# Patient Record
Sex: Male | Born: 1950 | Race: Asian | Hispanic: No | Marital: Married | Smoking: Former smoker
Health system: Southern US, Community
[De-identification: ages and names within clinical notes are randomized; demographics above are authoritative.]

## PROBLEM LIST (undated history)

## (undated) DIAGNOSIS — E119 Type 2 diabetes mellitus without complications: Secondary | ICD-10-CM

## (undated) DIAGNOSIS — E785 Hyperlipidemia, unspecified: Secondary | ICD-10-CM

## (undated) HISTORY — PX: OTHER SURGICAL HISTORY: SHX169

## (undated) HISTORY — DX: Hyperlipidemia, unspecified: E78.5

## (undated) HISTORY — DX: Type 2 diabetes mellitus without complications: E11.9

---

## 2015-09-17 ENCOUNTER — Ambulatory Visit (INDEPENDENT_AMBULATORY_CARE_PROVIDER_SITE_OTHER): Payer: Self-pay

## 2015-09-17 ENCOUNTER — Ambulatory Visit (INDEPENDENT_AMBULATORY_CARE_PROVIDER_SITE_OTHER): Payer: Self-pay | Admitting: Family Medicine

## 2015-09-17 VITALS — BP 160/92 | HR 91 | Temp 98.5°F | Resp 16 | Ht 68.0 in | Wt 148.0 lb

## 2015-09-17 DIAGNOSIS — R030 Elevated blood-pressure reading, without diagnosis of hypertension: Secondary | ICD-10-CM

## 2015-09-17 DIAGNOSIS — M5412 Radiculopathy, cervical region: Secondary | ICD-10-CM

## 2015-09-17 DIAGNOSIS — E119 Type 2 diabetes mellitus without complications: Secondary | ICD-10-CM

## 2015-09-17 DIAGNOSIS — M542 Cervicalgia: Secondary | ICD-10-CM

## 2015-09-17 DIAGNOSIS — IMO0001 Reserved for inherently not codable concepts without codable children: Secondary | ICD-10-CM

## 2015-09-17 DIAGNOSIS — Z79899 Other long term (current) drug therapy: Secondary | ICD-10-CM

## 2015-09-17 DIAGNOSIS — R739 Hyperglycemia, unspecified: Secondary | ICD-10-CM

## 2015-09-17 LAB — BASIC METABOLIC PANEL
BUN: 15 mg/dL (ref 7–25)
CALCIUM: 9.7 mg/dL (ref 8.6–10.3)
CO2: 26 mmol/L (ref 20–31)
Chloride: 99 mmol/L (ref 98–110)
Creat: 0.73 mg/dL (ref 0.70–1.25)
Glucose, Bld: 200 mg/dL — ABNORMAL HIGH (ref 65–99)
POTASSIUM: 3.8 mmol/L (ref 3.5–5.3)
SODIUM: 136 mmol/L (ref 135–146)

## 2015-09-17 LAB — POCT GLYCOSYLATED HEMOGLOBIN (HGB A1C): Hemoglobin A1C: 10

## 2015-09-17 LAB — GLUCOSE, POCT (MANUAL RESULT ENTRY): POC GLUCOSE: 188 mg/dL — AB (ref 70–99)

## 2015-09-17 MED ORDER — METFORMIN HCL 1000 MG PO TABS: 1000.0000 mg | ORAL_TABLET | Freq: Two times a day (BID) | ORAL | Status: DC

## 2015-09-17 MED ORDER — CYCLOBENZAPRINE HCL 5 MG PO TABS: ORAL_TABLET | ORAL | Status: DC

## 2015-09-17 MED ORDER — LISINOPRIL 5 MG PO TABS: 5.0000 mg | ORAL_TABLET | Freq: Every day | ORAL | Status: DC

## 2015-09-17 MED ORDER — TRAMADOL HCL 50 MG PO TABS: 50.0000 mg | ORAL_TABLET | Freq: Three times a day (TID) | ORAL | Status: DC | PRN

## 2015-09-17 NOTE — Patient Instructions (Addendum)
Because you received an x-ray today, you will receive an invoice from [facility]. Please contact [facility] at [phone] with questions or concerns regarding your invoice. Our billing staff will not be able to assist you with those questions.  Because you received labwork today, you will receive an invoice from [employer]. Please contact [facility] at [phone] with questions or concerns regarding your invoice. Our billing staff will not be able to assist you with those questions.  You will be contacted with the lab results as soon as they are available. The fastest way to get your results is to activate your My Chart account. Instructions are located on the last page of this paperwork. If you have not heard from [place] regarding the results in 2 weeks, please contact this office.  Your blood testing today does indicate that you have diabetes. Start metformin 1 pill once per day, see other information on diabetes below, and recheck in the next few weeks to discuss this further. Initially he can start the metformin 1 pill once per day for the first week, then increase to twice per day.  Your blood pressure is also elevated today, and you may have hypertension. See information on this condition below. I will start her on a low-dose blood pressure medicine today, they can also help protect her kidneys with diabetes.Keep a record of your blood pressures outside of the office and bring them to the next office visit in 2-3 weeks.   For the pain in your arm, we are somewhat limited in medications because of your elevated blood pressure and diabetes. Can take muscle relaxant up to every 8 hours, or if needed for severe pain, I did prescribe another pain medicine for you. Be careful combining these 2 medicines as they both can cause sedation.. If this is not helping your symptoms in the next week to 10 days, return as we may need to try prednisone at that time, but  this would elevate your blood sugar further.  Cervical Radiculopathy Cervical radiculopathy happens when a nerve in the neck (cervical nerve) is pinched or bruised. This condition can develop because of an injury or as part of the normal aging process. Pressure on the cervical nerves can cause pain or numbness that runs from the neck all the way down into the arm and fingers. Usually, this condition gets better with rest. Treatment may be needed if the condition does not improve.  CAUSES This condition may be caused by:  Injury.  Slipped (herniated) disk.  Muscle tightness in the neck because of overuse.  Arthritis.  Breakdown or degeneration in the bones and joints of the spine (spondylosis) due to aging.  Bone spurs that may develop near the cervical nerves. SYMPTOMS Symptoms of this condition include:  Pain that runs from the neck to the arm and hand. The pain can be severe or irritating. It may be worse when the neck is moved.  Numbness or weakness in the affected arm and hand. DIAGNOSIS This condition may be diagnosed based on symptoms, medical history, and a physical exam. You may also have tests, including:  X-rays.  CT scan.  MRI.  Electromyogram (EMG).  Nerve conduction tests. TREATMENT In many cases, treatment is not needed for this condition. With rest, the condition usually gets better over time. If treatment is needed, options may include:  Wearing a soft neck collar for short periods of time.  Physical therapy to strengthen your neck muscles.  Medicines, such as NSAIDs,  oral corticosteroids, or spinal injections.  Surgery. This may be needed if other treatments do not help. Various types of surgery may be done depending on the cause of your problems. HOME CARE INSTRUCTIONS Managing Pain  Take over-the-counter and prescription medicines only as told by your health care provider.  If directed, apply ice to the affected area.  Put ice in a plastic  bag.  Place a towel between your skin and the bag.  Leave the ice on for 20 minutes, 2-3 times per day.  If ice does not help, you can try using heat. Take a warm shower or warm bath, or use a heat pack as told by your health care provider.  Try a gentle neck and shoulder massage to help relieve symptoms. Activity  Rest as needed. Follow instructions from your health care provider about any restrictions on activities.  Do stretching and strengthening exercises as told by your health care provider or physical therapist. General Instructions  If you were given a soft collar, wear it as told by your health care provider.  Use a flat pillow when you sleep.  Keep all follow-up visits as told by your health care provider. This is important. SEEK MEDICAL CARE IF:  Your condition does not improve with treatment. SEEK IMMEDIATE MEDICAL CARE IF:  Your pain gets much worse and cannot be controlled with medicines.  You have weakness or numbness in your hand, arm, face, or leg.  You have a high fever.  You have a stiff, rigid neck.  You lose control of your bowels or your bladder (have incontinence).  You have trouble with walking, balance, or speaking.   This information is not intended to replace advice given to you by your health care provider. Make sure you discuss any questions you have with your health care provider.   Document Released: 03/29/2001 Document Revised: 03/25/2015 Document Reviewed: 08/28/2014 Elsevier Interactive Patient Education 2016 [employer].   Diabetes and Standards of Medical Care Diabetes is complicated. You may find that your diabetes team includes a dietitian, nurse, diabetes educator, eye doctor, and more. To help everyone know what is going on and to help you get the care you deserve, the following schedule of care was developed to help keep you on track. Below are the tests, exams, vaccines, medicines, education, and plans you will need. HbA1c  test This test shows how well you have controlled your glucose over the past 2-3 months. It is used to see if your diabetes management plan needs to be adjusted.   It is performed at least 2 times a year if you are meeting treatment goals.  It is performed 4 times a year if therapy has changed or if you are not meeting treatment goals. Blood pressure test  This test is performed at every routine medical visit. The goal is less than 140/90 mm Hg for most people, but 130/80 mm Hg in some cases. Ask your health care provider about your goal. Dental exam  Follow up with the dentist regularly. Eye exam  If you are diagnosed with type 1 diabetes as a child, get an exam upon reaching the age of 51 years or older and having had diabetes for 3-5 years. Yearly eye exams are recommended after that initial eye exam.  If you are diagnosed with type 1 diabetes as an adult, get an exam within 5 years of diagnosis and then yearly.  If you are diagnosed with type 2 diabetes, get an exam as soon as  possible after the diagnosis and then yearly. Foot care exam  Visual foot exams are performed at every routine medical visit. The exams check for cuts, injuries, or other problems with the feet.  You should have a complete foot exam performed every year. This exam includes an inspection of the structure and skin of your feet, a check of the pulses in your feet, and a check of the sensation in your feet.  Type 1 diabetes: The first exam is performed 5 years after diagnosis.  Type 2 diabetes: The first exam is performed at the time of diagnosis.  Check your feet nightly for cuts, injuries, or other problems with your feet. Tell your health care provider if anything is not healing. Kidney function test (urine microalbumin)  This test is performed once a year.  Type 1 diabetes: The first test is performed 5 years after diagnosis.  Type 2 diabetes: The first test is performed at the time of diagnosis.  A  serum creatinine and estimated glomerular filtration rate (eGFR) test is done once a year to assess the level of chronic kidney disease (CKD), if present. Lipid profile (cholesterol, HDL, LDL, triglycerides)  Performed every 5 years for most people.  The goal for LDL is less than 100 mg/dL. If you are at high risk, the goal is less than 70 mg/dL.  The goal for HDL is 40 mg/dL-50 mg/dL for men and 50 mg/dL-60 mg/dL for women. An HDL cholesterol of 60 mg/dL or higher gives some protection against heart disease.  The goal for triglycerides is less than 150 mg/dL. Immunizations  The flu (influenza) vaccine is recommended yearly for every person 72 months of age or older who has diabetes.  The pneumonia (pneumococcal) vaccine is recommended for every person 68 years of age or older who has diabetes. Adults 67 years of age or older may receive the pneumonia vaccine as a series of two separate shots.  The hepatitis B vaccine is recommended for adults shortly after they have been diagnosed with diabetes.  The Tdap (tetanus, diphtheria, and pertussis) vaccine should be given:  According to normal childhood vaccination schedules, for children.  Every 10 years, for adults who have diabetes. Diabetes self-management education  Education is recommended at diagnosis and ongoing as needed. Treatment plan  Your treatment plan is reviewed at every medical visit.   This information is not intended to replace advice given to you by your health care provider. Make sure you discuss any questions you have with your health care provider.   Document Released: 05/01/2009 Document Revised: 07/25/2014 Document Reviewed: 12/04/2012 Elsevier Interactive Patient Education 2016 [employer].    Hypertension Hypertension, commonly called high blood pressure, is when the force of blood pumping through your arteries is too strong. Your arteries are the blood vessels that carry blood from your heart throughout  your body. A blood pressure reading consists of a higher number over a lower number, such as 110/72. The higher number (systolic) is the pressure inside your arteries when your heart pumps. The lower number (diastolic) is the pressure inside your arteries when your heart relaxes. Ideally you want your blood pressure below 120/80. Hypertension forces your heart to work harder to pump blood. Your arteries may become narrow or stiff. Having untreated or uncontrolled hypertension can cause heart attack, stroke, kidney disease, and other problems. RISK FACTORS Some risk factors for high blood pressure are controllable. Others are not.  Risk factors you cannot control include:   Race. You may be  at higher risk if you are African American.  Age. Risk increases with age.  Gender. Men are at higher risk than women before age 61 years. After age 15, women are at higher risk than men. Risk factors you can control include:  Not getting enough exercise or physical activity.  Being overweight.  Getting too much fat, sugar, calories, or salt in your diet.  Drinking too much alcohol. SIGNS AND SYMPTOMS Hypertension does not usually cause signs or symptoms. Extremely high blood pressure (hypertensive crisis) may cause headache, anxiety, shortness of breath, and nosebleed. DIAGNOSIS To check if you have hypertension, your health care provider will measure your blood pressure while you are seated, with your arm held at the level of your heart. It should be measured at least twice using the same arm. Certain conditions can cause a difference in blood pressure between your right and left arms. A blood pressure reading that is higher than normal on one occasion does not mean that you need treatment. If it is not clear whether you have high blood pressure, you may be asked to return on a different day to have your blood pressure checked again. Or, you may be asked to monitor your blood pressure at home for 1 or more  weeks. TREATMENT Treating high blood pressure includes making lifestyle changes and possibly taking medicine. Living a healthy lifestyle can help lower high blood pressure. You may need to change some of your habits. Lifestyle changes may include:  Following the DASH diet. This diet is high in fruits, vegetables, and whole grains. It is low in salt, red meat, and added sugars.  Keep your sodium intake below 2,300 mg per day.  Getting at least 30-45 minutes of aerobic exercise at least 4 times per week.  Losing weight if necessary.  Not smoking.  Limiting alcoholic beverages.  Learning ways to reduce stress. Your health care provider may prescribe medicine if lifestyle changes are not enough to get your blood pressure under control, and if one of the following is true:  You are 26-74 years of age and your systolic blood pressure is above 140.  You are 68 years of age or older, and your systolic blood pressure is above 150.  Your diastolic blood pressure is above 90.  You have diabetes, and your systolic blood pressure is over 355 or your diastolic blood pressure is over 90.  You have kidney disease and your blood pressure is above 140/90.  You have heart disease and your blood pressure is above 140/90. Your personal target blood pressure may vary depending on your medical conditions, your age, and other factors. HOME CARE INSTRUCTIONS  Have your blood pressure rechecked as directed by your health care provider.   Take medicines only as directed by your health care provider. Follow the directions carefully. Blood pressure medicines must be taken as prescribed. The medicine does not work as well when you skip doses. Skipping doses also puts you at risk for problems.  Do not smoke.   Monitor your blood pressure at home as directed by your health care provider. SEEK MEDICAL CARE IF:   You think you are having a reaction to medicines taken.  You have recurrent headaches or  feel dizzy.  You have swelling in your ankles.  You have trouble with your vision. SEEK IMMEDIATE MEDICAL CARE IF:  You develop a severe headache or confusion.  You have unusual weakness, numbness, or feel faint.  You have severe chest or abdominal pain.  You vomit repeatedly.  You have trouble breathing. MAKE SURE YOU:   Understand these instructions.  Will watch your condition.  Will get help right away if you are not doing well or get worse.   This information is not intended to replace advice given to you by your health care provider. Make sure you discuss any questions you have with your health care provider.   Document Released: 07/04/2005 Document Revised: 11/18/2014 Document Reviewed: 04/26/2013 Elsevier Interactive Patient Education [employer].

## 2015-09-17 NOTE — Progress Notes (Addendum)
Subjective:    Patient ID: Richard Reeves, male    DOB: Nov 28, 1950, 65 y.o.   MRN: [id number] By signing my name below, I, Zola Button, attest that this documentation has been prepared under the direction and in the presence of Merri Ray, MD.  Electronically Signed: Zola Button, Medical Scribe. 09/17/2015. 9:48 AM.  HPI HPI Comments: Richard Reeves is a 65 y.o. male who presents to the [facility] complaining of left shoulder pain radiating into the left arm that started about 1.5 months ago, but worsened a few days ago. He has tried Advil but without relief. He has some discomfort when moving his neck. Patient denies weakness, chest pain and SOB. He also denies known injury and history of DM or HTN. He is right hand dominant. Patient works at [employer] and mopped the floor quickly a few days ago at work; he believes this may have caused the pain to worsen.  No PCP per patient. He has been told he has high blood sugar in the past, but no regular ongoing care.  Patient speaks [place]. His friend is here to translate.  At end of visit - notes he has had diabetes before and has been treated with metformin previously, but previous doctor no longer in practice.  No known history of hypertension. Home readings 130/85-90's.    There are no active problems to display for this patient.  History reviewed. No pertinent past medical history. History reviewed. No pertinent past surgical history. No Known Allergies Prior to Admission medications   Not on File   Social History   Social History  . Marital Status: Married    Spouse Name: N/A  . Number of Children: N/A  . Years of Education: N/A   Occupational History  . Not on file.   Social History Main Topics  . Smoking status: Never Smoker   . Smokeless tobacco: Not on file  . Alcohol Use: Not on file  . Drug Use: Not on file  . Sexual Activity: Not on file   Other Topics Concern  . Not on file   Social History  Narrative  . No narrative on file     Review of Systems  Respiratory: Negative for shortness of breath.   Cardiovascular: Negative for chest pain.  Musculoskeletal: Positive for arthralgias.  Neurological: Negative for weakness.       Objective:   Physical Exam  Constitutional: He is oriented to person, place, and time. He appears well-developed and well-nourished. No distress.  HENT:  Head: Normocephalic and atraumatic.  Mouth/Throat: Oropharynx is clear and moist. No oropharyngeal exudate.  Eyes: Pupils are equal, round, and reactive to light.  Neck: Neck supple.  Reproduction of pain with neck flexion. Slightly decreased extension. Normal right rotation. Pain with left rotation, slightly decreased ROM. Decreased left lateroflexion with reproduction of pain. Normal right lateroflexion.  Cardiovascular: Normal rate, regular rhythm and normal heart sounds.  Exam reveals no gallop and no friction rub.   No murmur heard. Pulmonary/Chest: Effort normal and breath sounds normal. No respiratory distress. He has no wheezes.  Clear to auscultation bilaterally.   Musculoskeletal: He exhibits tenderness. He exhibits no edema.  Slight tenderness along left rhomboid. Left shoulder: full ROM, no [place]/AC or clavicle tenderness.  Neurological: He is alert and oriented to person, place, and time. No cranial nerve deficit.  Equal grip strength. Equal strength in upper extremities. Full rotator cuff strength.  Skin: Skin is warm and dry. No rash  noted.  Psychiatric: He has a normal mood and affect. His behavior is normal.  Nursing note and vitals reviewed.   Filed Vitals:   09/17/15 0912  BP: 160/92  Pulse: 91  Temp: 98.5 F (36.9 C)  Resp: 16  Height: 5\' 8"  (1.727 m)  Weight: 148 lb (67.132 kg)  SpO2: 98%    Results for orders placed or performed in visit on 09/17/15  POCT glucose (manual entry)  Result Value Ref Range   POC Glucose 188 (A) 70 - 99 mg/dl  POCT glycosylated hemoglobin  (Hb A1C)  Result Value Ref Range   Hemoglobin A1C 10.0     Dg Cervical Spine 2 Or 3 Views  09/17/2015  CLINICAL DATA:  Neck pain. EXAM: CERVICAL SPINE - 2-3 VIEW COMPARISON:  No prior. FINDINGS: Diffuse degenerative change cervical spine. Prominent disc space loss noted C6-C7 with prominent endplate osteophytes. Loss of normal cervical lordosis. No acute bony abnormality . Mild biapical pleural thickening noted consistent with scarring. IMPRESSION: Loss of normal cervical lordosis. No acute abnormality. Prominent C6-C7 disc degeneration and endplate osteophyte formation. Electronically Signed   By: Marcello Moores  Register   On: 09/17/2015 10:23       Assessment & Plan:   Richard Reeves is a 65 y.o. male Neck pain on left side - Plan: DG Cervical Spine 2 or 3 views Left cervical radiculopathy - Plan: DG Cervical Spine 2 or 3 views, cyclobenzaprine (FLEXERIL) 5 MG tablet, traMADol (ULTRAM) 50 MG tablet High risk medication use - Plan: POCT glucose (manual entry), Basic metabolic panel  - L cervical radiculopathy.  No weakness, reflexes normal  -due to uncontreated diabetes and hypertension - will hold on prednisone or nsaids for now.   -flexeril tid prn, ultram if needed - SED and cautioned on combo.   -if not improved in next 7-10 days, rtc for possible prednisone.    Elevated blood pressure - Plan: Basic metabolic panel, lisinopril (PRINIVIL,ZESTRIL) 5 MG tablet  - suspected hypertension -untreated. Start lisinopril 5mg  qd and recheck in next 1 month  Hyperglycemia - Plan: POCT glycosylated hemoglobin (Hb A1C) Type 2 diabetes mellitus without complication, without long-term current use of insulin ([place]) - Plan: metFORMIN (GLUCOPHAGE) 1000 MG tablet, lisinopril (PRINIVIL,ZESTRIL) 5 MG tablet  - initially did not disclose he had been on medication, but had been taking metformin recently  - metfomin 500mg  BID - start QD and recheck in 1 month.    rtc precautions.   Language barrier - translator present,  understanding expressed.  Meds ordered this encounter  Medications  . metFORMIN (GLUCOPHAGE) 1000 MG tablet    Sig: Take 1 tablet (1,000 mg total) by mouth 2 (two) times daily with a meal. Start once per day for first week.    Dispense:  60 tablet    Refill:  1  . lisinopril (PRINIVIL,ZESTRIL) 5 MG tablet    Sig: Take 1 tablet (5 mg total) by mouth daily.    Dispense:  30 tablet    Refill:  1  . cyclobenzaprine (FLEXERIL) 5 MG tablet    Sig: 1 pill by mouth up to every 8 hours as needed. Start with one pill by mouth each bedtime as needed due to sedation    Dispense:  20 tablet    Refill:  0  . traMADol (ULTRAM) 50 MG tablet    Sig: Take 1 tablet (50 mg total) by mouth every 8 (eight) hours as needed.    Dispense:  30 tablet  Refill:  0   Patient Instructions  Because you received an x-ray today, you will receive an invoice from [facility]. Please contact [facility] at [phone] with questions or concerns regarding your invoice. Our billing staff will not be able to assist you with those questions.  Because you received labwork today, you will receive an invoice from [employer]. Please contact [facility] at [phone] with questions or concerns regarding your invoice. Our billing staff will not be able to assist you with those questions.  You will be contacted with the lab results as soon as they are available. The fastest way to get your results is to activate your My Chart account. Instructions are located on the last page of this paperwork. If you have not heard from [place] regarding the results in 2 weeks, please contact this office.  Your blood testing today does indicate that you have diabetes. Start metformin 1 pill once per day, see other information on diabetes below, and recheck in the next few weeks to discuss this further. Initially he can start the metformin 1 pill once per day for the first week, then increase to twice per  day.  Your blood pressure is also elevated today, and you may have hypertension. See information on this condition below. I will start her on a low-dose blood pressure medicine today, they can also help protect her kidneys with diabetes.Keep a record of your blood pressures outside of the office and bring them to the next office visit in 2-3 weeks.   For the pain in your arm, we are somewhat limited in medications because of your elevated blood pressure and diabetes. Can take muscle relaxant up to every 8 hours, or if needed for severe pain, I did prescribe another pain medicine for you. Be careful combining these 2 medicines as they both can cause sedation.. If this is not helping your symptoms in the next week to 10 days, return as we may need to try prednisone at that time, but this would elevate your blood sugar further.        I personally performed the services described in this documentation, which was scribed in my presence. The recorded information has been reviewed and considered, and addended by me as needed.

## 2015-09-23 ENCOUNTER — Ambulatory Visit (INDEPENDENT_AMBULATORY_CARE_PROVIDER_SITE_OTHER): Payer: Self-pay | Admitting: Urgent Care

## 2015-09-23 VITALS — BP 112/72 | HR 69 | Temp 97.6°F | Resp 16 | Ht 68.5 in | Wt 151.8 lb

## 2015-09-23 DIAGNOSIS — M542 Cervicalgia: Secondary | ICD-10-CM

## 2015-09-23 DIAGNOSIS — M5030 Other cervical disc degeneration, unspecified cervical region: Secondary | ICD-10-CM

## 2015-09-23 DIAGNOSIS — M5412 Radiculopathy, cervical region: Secondary | ICD-10-CM

## 2015-09-23 DIAGNOSIS — Z789 Other specified health status: Secondary | ICD-10-CM

## 2015-09-23 DIAGNOSIS — E119 Type 2 diabetes mellitus without complications: Secondary | ICD-10-CM

## 2015-09-23 MED ORDER — TRAMADOL HCL 50 MG PO TABS: 50.0000 mg | ORAL_TABLET | Freq: Three times a day (TID) | ORAL | Status: DC | PRN

## 2015-09-23 MED ORDER — CYCLOBENZAPRINE HCL 5 MG PO TABS: ORAL_TABLET | ORAL | Status: DC

## 2015-09-23 MED ORDER — MELOXICAM 7.5 MG PO TABS: 7.5000 mg | ORAL_TABLET | Freq: Every day | ORAL | Status: DC

## 2015-09-23 NOTE — Progress Notes (Signed)
    MRN: [id number] DOB: July 10, 1951  Subjective:   Richard Reeves is a 65 y.o. male presenting for follow up on neck pain. He was initially seen by Dr. Carlota Raspberry on 09/17/2015. X-ray showed prominent DDD with endplate osteophyte formation at C6-C7. He was subsequently prescribed Flexeril, Tramadol and advised to rtc in 7 days if no improvement. Today, he reports some improvement in his neck pain with the use of Flexeril and Tramadol. However, he still feels his neck pain radiate into his left arm up to the forearm. Denies trauma, fever, numbness or tingling, weakness, rashes.  Gralyn has a current medication list which includes the following prescription(s): cyclobenzaprine, lisinopril, metformin, and tramadol. Also has No Known Allergies.  Ad  has no past medical history on file. Also  has no past surgical history on file.  Objective:   Vitals: BP 112/72 mmHg  Pulse 69  Temp(Src) 97.6 F (36.4 C) (Oral)  Resp 16  Ht 5' 8.5" (1.74 m)  Wt 151 lb 12.8 oz (68.856 kg)  BMI 22.74 kg/m2  SpO2 98%  Physical Exam  Constitutional: He is oriented to person, place, and time. He appears well-developed and well-nourished.  HENT:  Mouth/Throat: Oropharynx is clear and moist.  Eyes: Pupils are equal, round, and reactive to light. No scleral icterus.  Cardiovascular: Normal rate, regular rhythm and intact distal pulses.  Exam reveals no gallop and no friction rub.   No murmur heard. Pulmonary/Chest: No respiratory distress. He has no wheezes. He has no rales.  Musculoskeletal:       Cervical back: He exhibits decreased range of motion (throughout), tenderness (Positive Spurling maneuver up to left bicep) and spasm (left trapezius). He exhibits no bony tenderness, no swelling, no edema, no deformity and no laceration.  Strength 5/5.  Neurological: He is alert and oriented to person, place, and time.  Skin: Skin is warm and dry.    Cervical Spine x-ray from 09/17/2015 IMPRESSION: Loss of normal cervical  lordosis. No acute abnormality. Prominent C6-C7 disc degeneration and endplate osteophyte formation.  Assessment and Plan :   1. Language barrier - Patient's son Katherine Roan is helping [place] translate today. He provided his phone number as well: [phone]  2. Degenerative disc disease, cervical 3. Left cervical radiculopathy 4. Neck pain - Counseled on diagnosis of DDD, offered meloxicam, refilled Tramadol, Flexeril. Referral to ortho pending per patient's request.  5. Type 2 diabetes mellitus without complication, without long-term current use of insulin ([place]) - Uncontrolled diabetes. Cr is well within normal limits. I am unwilling to do use prednisone due to his high A1c. Will use meloxicam instead. Patient is to continue efforts at diabetes control. Consider prednisone in the future if appropriate.  Jaynee Eagles, PA-C [facility] [phone] 09/23/2015 10:22 AM

## 2015-09-23 NOTE — Patient Instructions (Signed)
Degenerative Disk Disease  Degenerative disk disease is a condition caused by the changes that occur in spinal disks as you grow older. Spinal disks are soft and compressible disks located between the bones of your spine (vertebrae). These disks act like shock absorbers. Degenerative disk disease can affect the whole spine. However, the neck and lower back are most commonly affected. Many changes can occur in the spinal disks with aging, such as:  · The spinal disks may dry and shrink.  · Small tears may occur in the tough, outer covering of the disk (annulus).  · The disk space may become smaller due to loss of water.  · Abnormal growths in the bone (spurs) may occur. This can put pressure on the nerve roots exiting the spinal canal, causing pain.  · The spinal canal may become narrowed.  RISK FACTORS   · Being overweight.  · Having a family history of degenerative disk disease.  · Smoking.  · There is increased risk if you are doing heavy lifting or have a sudden injury.  SIGNS AND SYMPTOMS   Symptoms vary from person to person and may include:  · Pain that varies in intensity. Some people have no pain, while others have severe pain. The location of the pain depends on the part of your backbone that is affected.  ¨ You will have neck or arm pain if a disk in the neck area is affected.  ¨ You will have pain in your back, buttocks, or legs if a disk in the lower back is affected.  · Pain that becomes worse while bending, reaching up, or with twisting movements.  · Pain that may start gradually and then get worse as time passes. It may also start after a major or minor injury.  · Numbness or tingling in the arms or legs.  DIAGNOSIS   Your health care provider will ask you about your symptoms and about activities or habits that may cause the pain. He or she may also ask about any injuries, diseases, or treatments you have had. Your health care provider will examine you to check for the range of movement that is  possible in the affected area, to check for strength in your extremities, and to check for sensation in the areas of the arms and legs supplied by different nerve roots. You may also have:   · An X-ray of the spine.  · Other imaging tests, such as MRI.  TREATMENT   Your health care provider will advise you on the best plan for treatment. Treatment may include:  · Medicines.  · Rehabilitation exercises.  HOME CARE INSTRUCTIONS   · Follow proper lifting and walking techniques as advised by your health care provider.  · Maintain good posture.  · Exercise regularly as advised by your health care provider.  · Perform relaxation exercises.  · Change your sitting, standing, and sleeping habits as advised by your health care provider.  · Change positions frequently.  · Lose weight or maintain a healthy weight as advised by your health care provider.  · Do not use any tobacco products, including cigarettes, chewing tobacco, or electronic cigarettes. If you need help quitting, ask your health care provider.  · Wear supportive footwear.  · Take medicines only as directed by your health care provider.  SEEK MEDICAL CARE IF:   · Your pain does not go away within 1-4 weeks.  · You have significant appetite or weight loss.  SEEK IMMEDIATE MEDICAL CARE IF:   ·   Your pain is severe.  · You notice weakness in your arms, hands, or legs.  · You begin to lose control of your bladder or bowel movements.  · You have fevers or night sweats.  MAKE SURE YOU:   · Understand these instructions.  · Will watch your condition.  · Will get help right away if you are not doing well or get worse.     This information is not intended to replace advice given to you by your health care provider. Make sure you discuss any questions you have with your health care provider.     Document Released: 05/01/2007 Document Revised: 07/25/2014 Document Reviewed: 11/05/2013  Elsevier Interactive Patient Education ©2016 [employer]

## 2016-01-01 ENCOUNTER — Emergency Department (HOSPITAL_COMMUNITY)
Admission: EM | Admit: 2016-01-01 | Discharge: 2016-01-01 | Disposition: A | Discharge status: Home / Self Care | Payer: Self-pay | Attending: Emergency Medicine | Admitting: Emergency Medicine

## 2016-01-01 ENCOUNTER — Ambulatory Visit (INDEPENDENT_AMBULATORY_CARE_PROVIDER_SITE_OTHER): Payer: Self-pay | Admitting: Family Medicine

## 2016-01-01 ENCOUNTER — Encounter (HOSPITAL_COMMUNITY): Payer: Self-pay

## 2016-01-01 VITALS — BP 142/76 | HR 69 | Temp 98.0°F | Resp 14 | Ht 68.0 in | Wt 152.0 lb

## 2016-01-01 DIAGNOSIS — Y929 Unspecified place or not applicable: Secondary | ICD-10-CM | POA: Insufficient documentation

## 2016-01-01 DIAGNOSIS — Y939 Activity, unspecified: Secondary | ICD-10-CM | POA: Insufficient documentation

## 2016-01-01 DIAGNOSIS — Z23 Encounter for immunization: Secondary | ICD-10-CM

## 2016-01-01 DIAGNOSIS — W2203XA Walked into furniture, initial encounter: Secondary | ICD-10-CM | POA: Insufficient documentation

## 2016-01-01 DIAGNOSIS — S0180XA Unspecified open wound of other part of head, initial encounter: Secondary | ICD-10-CM

## 2016-01-01 DIAGNOSIS — S0181XA Laceration without foreign body of other part of head, initial encounter: Secondary | ICD-10-CM | POA: Insufficient documentation

## 2016-01-01 DIAGNOSIS — Y999 Unspecified external cause status: Secondary | ICD-10-CM | POA: Insufficient documentation

## 2016-01-01 DIAGNOSIS — Z5321 Procedure and treatment not carried out due to patient leaving prior to being seen by health care provider: Secondary | ICD-10-CM | POA: Insufficient documentation

## 2016-01-01 NOTE — ED Notes (Signed)
Patient present with 1 in laceration to the side of the left eye bleeding controlled.  Patient states that he rolled over into the bed this morning and hit his head on the corner of the bedside table.  Patient rates pain 5/10

## 2016-01-01 NOTE — Progress Notes (Signed)
By signing my name below I, Tereasa Coop, attest that this documentation has been prepared under the direction and in the presence of Wendie Agreste, MD. Electonically Signed. Tereasa Coop, Scribe 01/01/2016 at 9:33 AM  Subjective:    Patient ID: Richard Reeves, male    DOB: 1951-01-03, 65 y.o.   MRN: [id number]  Chief Complaint  Patient presents with  . Head Injury    Rolled over in sleep and hit left side of head    HPI Terrial Mizer is a 65 y.o. male who presents to the [facility] complaining of pain in left temporal region that started this morning, 4 hrs ago, when pt rolled out of bed while sleeping and hit his head against his bed side table. Pt denies any change in vision, LOC, HA, neck pain, dizziness, lightheadedness, or N/V/D. Pt c/o a laceration on his left temporal region and states he put peroxide over wound this morning.   Pt states his tetanus shot was more than 5 years ago.     There are no active problems to display for this patient.  No past medical history on file. No past surgical history on file. No Known Allergies Prior to Admission medications   Medication Sig Start Date End Date Taking? Authorizing Provider  cyclobenzaprine (FLEXERIL) 5 MG tablet Take 1 pill every 8 hours as needed. If you experience drowsiness, then take at bedtime only. Patient not taking: Reported on 01/01/2016 09/23/15   Jaynee Eagles, PA-C  lisinopril (PRINIVIL,ZESTRIL) 5 MG tablet Take 1 tablet (5 mg total) by mouth daily. Patient not taking: Reported on 01/01/2016 09/17/15   Wendie Agreste, MD  meloxicam (MOBIC) 7.5 MG tablet Take 1-2 tablets (7.5-15 mg total) by mouth daily. Patient not taking: Reported on 01/01/2016 09/23/15   Jaynee Eagles, PA-C  metFORMIN (GLUCOPHAGE) 1000 MG tablet Take 1 tablet (1,000 mg total) by mouth 2 (two) times daily with a meal. Start once per day for first week. Patient not taking: Reported on 01/01/2016 09/17/15   Wendie Agreste, MD  traMADol (ULTRAM) 50  MG tablet Take 1 tablet (50 mg total) by mouth every 8 (eight) hours as needed. Patient not taking: Reported on 01/01/2016 09/23/15   Jaynee Eagles, PA-C   Social History   Social History  . Marital Status: Married    Spouse Name: N/A  . Number of Children: N/A  . Years of Education: N/A   Occupational History  . Not on file.   Social History Main Topics  . Smoking status: Never Smoker   . Smokeless tobacco: Not on file  . Alcohol Use: Not on file  . Drug Use: Not on file  . Sexual Activity: Not on file   Other Topics Concern  . Not on file   Social History Narrative      Review of Systems  Eyes: Negative for visual disturbance.  Gastrointestinal: Negative for nausea, vomiting and diarrhea.  Musculoskeletal: Negative for neck pain.  Skin: Positive for wound (left temporal laceration).  Neurological: Negative for dizziness, light-headedness and headaches.       Objective:   Physical Exam  Constitutional: He is oriented to person, place, and time. He appears well-developed and well-nourished. No distress.  HENT:  Head: Normocephalic and atraumatic.  Eyes: Conjunctivae and EOM are normal. Pupils are equal, round, and reactive to light. Right eye exhibits no nystagmus. Left eye exhibits no nystagmus.  Pain free ROM of eyes and orbit is non-tender.   Neck:  Neck supple.  Cardiovascular: Normal rate.   Pulmonary/Chest: Effort normal.  Musculoskeletal: Normal range of motion.  Neurological: He is alert and oriented to person, place, and time. He has normal strength. No cranial nerve deficit or sensory deficit. He displays a negative Romberg sign. Gait normal.  No pronator drift. Non-focal neuro exam.   Skin: Skin is warm and dry. Laceration noted.  Pt has a Curved/linear 2.5cm laceration above and lateral to left orbit. Wound is widened centrally.   Psychiatric: He has a normal mood and affect. His behavior is normal.  Nursing note and vitals reviewed.    Filed Vitals:    01/01/16 0851  BP: 142/76  Pulse: 69  Temp: 98 F (36.7 C)  TempSrc: Oral  Resp: 14  Height: 5\' 8"  (1.727 m)  Weight: 152 lb (68.947 kg)  SpO2: 97%         Assessment & Plan:    Deyon Wesson is a 65 y.o. male Wound, open, face, initial encounter - Plan: Tdap vaccine greater than or equal to 7yo IM Wound of left face as above. No other signs/symptoms of head injury at this time.  Wound repaired as an procedure note, Tdap updated, RTC precautions discussed, head injury precautions discussed.    No orders of the defined types were placed in this encounter.   Patient Instructions       IF you received an x-ray today, you will receive an invoice from [facility]. Please contact [facility] at [phone] with questions or concerns regarding your invoice.   IF you received labwork today, you will receive an invoice from [employer]. Please contact [facility] at [phone] with questions or concerns regarding your invoice.   Our billing staff will not be able to assist you with questions regarding bills from these companies.  You will be contacted with the lab results as soon as they are available. The fastest way to get your results is to activate your My Chart account. Instructions are located on the last page of this paperwork. If you have not heard from [place] regarding the results in 2 weeks, please contact this office.    WOUND CARE Please return in 5-7 days to have your stitches/staples removed or sooner if you have concerns. Marland Kitchen Keep area clean and dry for 24 hours. Do not remove bandage, if applied. . After 24 hours, remove bandage and wash wound gently with mild soap and warm water. Reapply a new bandage after cleaning wound, if directed. . Continue daily cleansing with soap and water until stitches/staples are removed. . Do not apply any ointments or creams to the wound while stitches/staples are in place, as this may  cause delayed healing. . Notify the office if you experience any of the following signs of infection: Swelling, redness, pus drainage, streaking, fever >101.0 F . Notify the office if you experience excessive bleeding that does not stop after 15-20 minutes of constant, firm Pressure.  HEAD INJURY If any of the following occur notify your physician or go to the Hospital Emergency Department: . Increased drowsiness, stupor or loss of consciousness . Restlessness or convulsions (fits) . Paralysis in arms or legs . Temperature above 100 F . Vomiting . Severe headache . Blood or clear fluid dripping from the nose or ears . Stiffness of the neck . Dizziness or blurred vision . Pulsating pain in the eye . Unequal pupils of eye . Personality changes . Any other unusual symptoms PRECAUTIONS . Keep head elevated at  all times for the first 24 hours (Elevate mattress if pillow is ineffective) . Do not take tranquilizers, sedatives, narcotics or alcohol . Avoid aspirin. Use only acetaminophen (e.g. Tylenol) or ibuprofen (e.g. Advil) for relief of pain. Follow directions on the bottle for dosage. . Use ice packs for comfort  MEDICATIONS Use medications only as directed by your physician        I personally performed the services described in this documentation, which was scribed in my presence. The recorded information has been reviewed and considered, and addended by me as needed.   Signed,   Merri Ray, MD [facility].  01/01/2016 9:35 AM

## 2016-01-01 NOTE — Patient Instructions (Addendum)
     IF you received an x-ray today, you will receive an invoice from [facility]. Please contact [facility] at [phone] with questions or concerns regarding your invoice.   IF you received labwork today, you will receive an invoice from [employer]. Please contact [facility] at [phone] with questions or concerns regarding your invoice.   Our billing staff will not be able to assist you with questions regarding bills from these companies.  You will be contacted with the lab results as soon as they are available. The fastest way to get your results is to activate your My Chart account. Instructions are located on the last page of this paperwork. If you have not heard from [place] regarding the results in 2 weeks, please contact this office.    WOUND CARE Please return in 5-7 days to have your stitches/staples removed or sooner if you have concerns. Marland Kitchen Keep area clean and dry for 24 hours. Do not remove bandage, if applied. . After 24 hours, remove bandage and wash wound gently with mild soap and warm water. Reapply a new bandage after cleaning wound, if directed. . Continue daily cleansing with soap and water until stitches/staples are removed. . Do not apply any ointments or creams to the wound while stitches/staples are in place, as this may cause delayed healing. . Notify the office if you experience any of the following signs of infection: Swelling, redness, pus drainage, streaking, fever >101.0 F . Notify the office if you experience excessive bleeding that does not stop after 15-20 minutes of constant, firm Pressure.  HEAD INJURY If any of the following occur notify your physician or go to the Hospital Emergency Department: . Increased drowsiness, stupor or loss of consciousness . Restlessness or convulsions (fits) . Paralysis in arms or legs . Temperature above 100 F . Vomiting . Severe headache . Blood or clear fluid  dripping from the nose or ears . Stiffness of the neck . Dizziness or blurred vision . Pulsating pain in the eye . Unequal pupils of eye . Personality changes . Any other unusual symptoms PRECAUTIONS . Keep head elevated at all times for the first 24 hours (Elevate mattress if pillow is ineffective) . Do not take tranquilizers, sedatives, narcotics or alcohol . Avoid aspirin. Use only acetaminophen (e.g. Tylenol) or ibuprofen (e.g. Advil) for relief of pain. Follow directions on the bottle for dosage. . Use ice packs for comfort  MEDICATIONS Use medications only as directed by your physician

## 2016-01-01 NOTE — Progress Notes (Signed)
Risk and benefits discussed and verbal consent obtained. Anesthetic allergies reviewed. Patient anesthetized using 1:1 mix of 2% lidocaine with epi and Marcaine. The wound was cleansed thoroughly with soap and water. Sterile prep and drape. Wound closed with 7 SI throws using 6-0 Ethilon suture material. Hemostasis achieved. Mupirocin applied to the wound and bandage placed. The patient tolerated well. Wound instructions were provided and the patient is to return in 7 days for suture removal. Philis Fendt, MS, PA-C 10:10 AM, 01/01/2016

## 2017-06-21 LAB — BASIC METABOLIC PANEL
BUN: 15 (ref 4–21)
Creatinine: 0.9 (ref 0.6–1.3)
GLUCOSE: 125
POTASSIUM: 3.9 (ref 3.4–5.3)
SODIUM: 137 (ref 137–147)

## 2017-06-21 LAB — CBC AND DIFFERENTIAL
HEMATOCRIT: 41 (ref 41–53)
HEMOGLOBIN: 13.9 (ref 13.5–17.5)
Platelets: 213 (ref 150–399)
WBC: 5.8

## 2017-06-21 LAB — LIPID PANEL
CHOLESTEROL: 221 — AB (ref 0–200)
HDL: 53 (ref 35–70)
LDL CALC: 141
Triglycerides: 144 (ref 40–160)

## 2017-06-21 LAB — HEMOGLOBIN A1C: Hemoglobin A1C: 9

## 2017-06-21 LAB — MICROALBUMIN, URINE: MICROALB UR: 1.4

## 2017-06-21 LAB — HEPATIC FUNCTION PANEL
ALT: 23 (ref 10–40)
AST: 24 (ref 14–40)
Alkaline Phosphatase: 82 (ref 25–125)
Bilirubin, Total: 3

## 2017-06-21 LAB — PSA: PSA: 0.6

## 2017-06-21 LAB — TSH: TSH: 3.07 (ref 0.41–5.90)

## 2017-07-10 ENCOUNTER — Encounter: Payer: Self-pay | Admitting: Family Medicine

## 2017-07-10 ENCOUNTER — Ambulatory Visit: Payer: Self-pay | Admitting: Family Medicine

## 2017-07-10 VITALS — BP 142/84 | HR 64 | Temp 98.2°F | Ht 68.0 in | Wt 160.6 lb

## 2017-07-10 DIAGNOSIS — M7989 Other specified soft tissue disorders: Secondary | ICD-10-CM

## 2017-07-10 DIAGNOSIS — E1169 Type 2 diabetes mellitus with other specified complication: Secondary | ICD-10-CM

## 2017-07-10 DIAGNOSIS — E119 Type 2 diabetes mellitus without complications: Secondary | ICD-10-CM

## 2017-07-10 DIAGNOSIS — J3489 Other specified disorders of nose and nasal sinuses: Secondary | ICD-10-CM

## 2017-07-10 DIAGNOSIS — E785 Hyperlipidemia, unspecified: Secondary | ICD-10-CM

## 2017-07-10 LAB — BILIRUBIN, FRACTIONATED(TOT/DIR/INDIR)
BILIRUBIN TOTAL: 1.3 mg/dL — AB (ref 0.2–1.2)
Bilirubin, Direct: 0.3 mg/dL — ABNORMAL HIGH (ref 0.0–0.2)
Indirect Bilirubin: 1 mg/dL (calc) (ref 0.2–1.2)

## 2017-07-10 MED ORDER — METFORMIN HCL 1000 MG PO TABS: 1000.0000 mg | ORAL_TABLET | Freq: Two times a day (BID) | ORAL | 11 refills | Status: DC

## 2017-07-10 MED ORDER — GLIPIZIDE 5 MG PO TABS: 5.0000 mg | ORAL_TABLET | Freq: Every day | ORAL | 11 refills | Status: DC

## 2017-07-10 NOTE — Assessment & Plan Note (Signed)
Likely venous insufficiency.  No signs of heart failure.  Renal function normal on recent lab testing.  No proteinuria.  Discussed conservative measures including feet elevation, compression stockings, and low-salt diet.

## 2017-07-10 NOTE — Assessment & Plan Note (Signed)
Likely benign etiology.  Check fractionated bilirubin level today.

## 2017-07-10 NOTE — Assessment & Plan Note (Addendum)
Recent A1c 9.0.  Continue metformin 1000 mg twice daily.  Patient is currently uninsured and has limited ability to afford medications.  Start glipizide 5 mg daily.  Follow-up in 3 months.  Advised patient to get diabetic eye exam soon.  Recently had a urine microalbumin/creatinine ratio which was normal.  We will obtain records from his previous PCP.

## 2017-07-10 NOTE — Progress Notes (Signed)
Subjective:  Richard Reeves is a 66 y.o. male who presents today with a chief complaint of type 2 diabetes and to establish care.   HPI:  Type 2 diabetes, new problem Several year history.  Patient reports that he has been on metformin 500 mg daily for at least 10 years.  He recently had his A1c checked about 3 weeks ago and it was 9.0.  Reports that his physician at told him to increase his metformin to 1000 mg twice daily about 2 months ago.  He has been stable on this dose without any side effects.  No diarrhea or nausea.  No polyuria or polydipsia.  Hyperlipidemia, new problem Several year history.  He was just recently started on pravastatin a few weeks ago.  He has been tolerating this dose well without any side effects.  No myalgias.  No chest pain or shortness of breath.  Elevated bilirubin level, new problem Incidentally noted on his blood work from about 3 weeks ago.  Denies any history of this in the past.  No history of scleral icterus or jaundice.  No abdominal pain.  No nausea or vomiting.  No fevers or chills.  No weight loss.  Lower extremity swelling, new problem Several year history.  Has bilateral lower extremity swelling.  Symptoms have worsened over the last few months.  Swelling worsens throughout the day and is worse in the evening.  No home treatments tried.  No pain.  No shortness of breath or orthopnea.  No other obvious alleviating or aggravating factors.  Rhinorrhea, new problem Started about a month ago.  Associated with some cough and throat pain.  Symptoms have been stable over the last month.  No home treatments tried.  No fevers or chills.  Symptoms worse at night.  ROS: Per HPI, otherwise a 14 point review of systems was performed and was negative  PMH:  The following were reviewed and entered/updated in epic: Past Medical History:  Diagnosis Date  . Diabetes mellitus without complication ([place])   . Hyperlipidemia    Patient Active Problem List   Diagnosis Date Noted  . Type 2 diabetes mellitus without complication, without long-term current use of insulin ([place]) 07/10/2017  . Hyperlipidemia associated with type 2 diabetes mellitus ([place]) 07/10/2017  . Hyperbilirubinemia 07/10/2017  . Swelling of lower extremity 07/10/2017   History reviewed. No pertinent surgical history.  Family History  Problem Relation Age of Onset  . Cancer Mother     Medications- reviewed and updated Current Outpatient Medications  Medication Sig Dispense Refill  . aspirin EC 81 MG tablet Take 81 mg by mouth daily.    . pravastatin (PRAVACHOL) 20 MG tablet Take 20 mg by mouth daily.    Marland Kitchen glipiZIDE (GLUCOTROL) 5 MG tablet Take 1 tablet (5 mg total) by mouth daily before breakfast. 30 tablet 11  . metFORMIN (GLUCOPHAGE) 1000 MG tablet Take 1 tablet (1,000 mg total) by mouth 2 (two) times daily with a meal. 60 tablet 11   No current facility-administered medications for this visit.     Allergies-reviewed and updated No Known Allergies  Social History   Socioeconomic History  . Marital status: Married    Spouse name: None  . Number of children: None  . Years of education: None  . Highest education level: None  Social Needs  . Financial resource strain: None  . Food insecurity - worry: None  . Food insecurity - inability: None  . Transportation needs - medical: None  .  Transportation needs - non-medical: None  Occupational History  . None  Tobacco Use  . Smoking status: Never Smoker  . Smokeless tobacco: Never Used  Substance and Sexual Activity  . Alcohol use: Yes    Alcohol/week: 0.0 oz    Comment: Occasonally  . Drug use: No  . Sexual activity: None  Other Topics Concern  . None  Social History Narrative  . None    Objective:  Physical Exam: BP (!) 142/84 (BP Location: Left Arm, Patient Position: Sitting, Cuff Size: Normal)   Pulse 64   Temp 98.2 F (36.8 C) (Oral)   Ht _0  (1.727 m)   Wt 160 lb 9.6 oz (72.8 kg)   SpO2 95%    BMI 24.42 kg/m   Gen: NAD, resting comfortably HEENT: TMs clear bilaterally.  Nasal mucosa erythematous with clear nasal discharge.  Maxillary sinuses clear to transillumination bilaterally.  No lymphadenopathy. CV: RRR with no murmurs appreciated Pulm: NWOB, CTAB with no crackles, wheezes, or rhonchi GI: Normal bowel sounds present. Soft, Nontender, Nondistended. MSK: No edema, cyanosis, or clubbing noted Skin: Warm, dry Neuro: Grossly normal, moves all extremities Psych: Normal affect and thought content  Review/summary of pertinent outside labs: Lipid panel 06/21/2017: Total cholesterol 221, HDL 53, triglycerides 144, LDL 141 CMP 06/21/2017: BUN 15, creatinine 0.93, GFR 86, sodium 137, potassium 3.9, chloride 100, CO2 28, calcium 9.9, total protein 7.5, albumin 4.9, bilirubin 3.0, alk phos 82, AST 24, ALT 23 Urine microalbumin/creatinine ratio: 8  CBC 06/21/2017: WBC 5.8, hemoglobin 13.9, hematocrit 40.7, platelets 213 PSA 0.6 TSH 3.07 Hemoglobin A1c 9.0  Assessment/Plan:  Type 2 diabetes mellitus without complication, without long-term current use of insulin (HCC) Recent A1c 9.0.  Continue metformin 1000 mg twice daily.  Patient is currently uninsured and has limited ability to afford medications.  Start glipizide 5 mg daily.  Follow-up in 3 months.  Advised patient to get diabetic eye exam soon.  Recently had a urine microalbumin/creatinine ratio which was normal.  We will obtain records from his previous PCP.    Hyperlipidemia associated with type 2 diabetes mellitus (HCC) Continue pravastatin.  Recheck lipid panel in 6-12 months.  Hyperbilirubinemia Likely benign etiology.  Check fractionated bilirubin level today.  Swelling of lower extremity Likely venous insufficiency.  No signs of heart failure.  Renal function normal on recent lab testing.  No proteinuria.  Discussed conservative measures including feet elevation, compression stockings, and low-salt  diet.  Rhinorrhea Likely secondary to viral URI.  Start Atrovent nasal spray.  Return precautions reviewed.  Follow-up as needed.  Preventative healthcare We will obtain records from his previous PCP.  [name]. [name], MD 07/10/2017 10:56 AM

## 2017-07-10 NOTE — Assessment & Plan Note (Signed)
Continue pravastatin.  Recheck lipid panel in 6-12 months.

## 2017-07-10 NOTE — Patient Instructions (Signed)
Start the glipizide.  Please let me know if you have any symptoms concerning for low blood sugar.  We will send in a 1000 mg tablet for metformin.  Please take this twice daily.  Continue the pravastatin.  We will check another blood test for your bilirubin level.  Your lower extremity swelling is likely due to venous insufficiency.  Please elevate your legs as much as possible.  You can also look into starting compression stockings.  Please also limit sodium intake to 1500 mg/day or less.  Please come back to see me in 3 months, or sooner as needed.  Take care, Dr. [name]

## 2017-07-12 NOTE — Progress Notes (Signed)
Patient's bilirubin levels are normal. No further testing needed at this time.  Richard Reeves. [name], MD 07/12/2017 7:58 AM

## 2017-07-21 ENCOUNTER — Encounter: Payer: Self-pay | Admitting: Physical Therapy

## 2017-10-09 ENCOUNTER — Telehealth: Payer: Self-pay | Admitting: Family Medicine

## 2017-10-09 ENCOUNTER — Encounter: Payer: Self-pay | Admitting: Family Medicine

## 2017-10-09 ENCOUNTER — Ambulatory Visit: Payer: Self-pay | Admitting: Family Medicine

## 2017-10-09 ENCOUNTER — Other Ambulatory Visit: Payer: Self-pay

## 2017-10-09 VITALS — BP 138/82 | HR 68 | Temp 97.9°F | Wt 157.0 lb

## 2017-10-09 DIAGNOSIS — E119 Type 2 diabetes mellitus without complications: Secondary | ICD-10-CM

## 2017-10-09 DIAGNOSIS — E1169 Type 2 diabetes mellitus with other specified complication: Secondary | ICD-10-CM

## 2017-10-09 DIAGNOSIS — E785 Hyperlipidemia, unspecified: Secondary | ICD-10-CM

## 2017-10-09 DIAGNOSIS — R1011 Right upper quadrant pain: Secondary | ICD-10-CM

## 2017-10-09 LAB — POCT GLYCOSYLATED HEMOGLOBIN (HGB A1C): HEMOGLOBIN A1C: 6.3

## 2017-10-09 MED ORDER — PRAVASTATIN SODIUM 20 MG PO TABS: 20.0000 mg | ORAL_TABLET | Freq: Every day | ORAL | 0 refills | Status: DC

## 2017-10-09 MED ORDER — OMEPRAZOLE 40 MG PO CPDR: 40.0000 mg | DELAYED_RELEASE_CAPSULE | Freq: Every day | ORAL | 3 refills | Status: DC

## 2017-10-09 NOTE — Telephone Encounter (Signed)
Copied from [place]. Topic: Quick Communication - Rx Refill/Question >> Oct 09, 2017 12:34 PM Scherrie Gerlach wrote: Medication: pravastatin (PRAVACHOL) 20 MG tablet [facility] calling for the pt. Pt thought this med was going to be called in after appt today with Dr [name]. Can you send in for the pt?  Pt is out.  [address], [place] [phone] (Phone) [phone] (Fax)

## 2017-10-09 NOTE — Assessment & Plan Note (Signed)
Continue pravastatin 20 mg daily.  Continue baby aspirin daily.

## 2017-10-09 NOTE — Assessment & Plan Note (Addendum)
A1c improved to 6.3.  Given his hypoglycemic episodes with glipizide, we will stop this medication today.  Continue metformin 1000 mg twice daily.  Follow-up in 6 months.  Due for preventative diabetes care-this was not discussed today due to acute complaint.  Will need to be addressed at next office visit.

## 2017-10-09 NOTE — Progress Notes (Signed)
    Subjective:  Richard Reeves is a 67 y.o. male who presents today with a chief complaint of type 2 diabetes follow-up.  History provided by patient via interpreter.  HPI:  T2DM, established problem, improving Patient seen 3 months ago for this. At that time, had an A1c of 9.0%.  At that time was on metformin 1000mg  twice daily. Glipizide 5mg  daily was added to his regimen. Occasionally feels like sweatiness if he skips breakfast.  This has happened about 4 times since.   HLD, established problem, stable Currently on pravastatin 20mg  daily. Tolerates this well without side effects.   RUQ Pain, New problem Started about 2 months ago. Stable over that time. No obvious precipitating events. Located in the RUQ of his abdomen.  Pain is sometimes worse after eating and drinking.  Tried taking Zantac which did not help.  No shortness of breath.  No nausea or vomiting.  No fevers or chills.  Symptoms are nonexertional.  Feels like a discomfort, dull ache.  No other obvious alleviating or aggravating factors. No current pain.   ROS: Per HPI  PMH: He reports that he has quit smoking. He has never used smokeless tobacco. He reports that he does not drink alcohol or use drugs.   Objective:  Physical Exam: BP 138/82 (BP Location: Left Arm)   Pulse 68   Temp 97.9 F (36.6 C)   Wt 157 lb (71.2 kg)   SpO2 98%   BMI 23.87 kg/m   Gen: NAD, resting comfortably CV: RRR with no murmurs appreciated Pulm: NWOB, CTAB with no crackles, wheezes, or rhonchi GI: Normal bowel sounds present. Soft, Nontender, Nondistended.  Murphy sign negative. Chest: No deformities.  Nontender to palpation.  Results for orders placed or performed in visit on 10/09/17 (from the past 24 hour(s))  POCT glycosylated hemoglobin (Hb A1C)     Status: None   Collection Time: 10/09/17 10:03 AM  Result Value Ref Range   Hemoglobin A1C 6.3    Assessment/Plan:  Type 2 diabetes mellitus without complication, without long-term current  use of insulin (HCC) A1c improved to 6.3.  Given his hypoglycemic episodes with glipizide, we will stop this medication today.  Continue metformin 1000 mg twice daily.  Follow-up in 6 months.  Due for preventative diabetes care-this was not discussed today due to acute complaint.  Will need to be addressed at next office visit.  Hyperlipidemia associated with type 2 diabetes mellitus (HCC) Continue pravastatin 20 mg daily.  Continue baby aspirin daily.  Right upper quadrant abdominal pain Broad differential.  Seems to be most consistent with GI etiology given mild worsening after eating.  We will start omeprazole.  With also consider pulmonary etiology given his smoking history.  Doubt cardiac etiology given nonexertional history and atypical description/location of pain.  Discussed workup at today's visit including chest x-ray, CMET, lipase, and H. pylori.  Patient is currently uninsured and deferred having this workup done today.  Given that his symptoms have been present for several months, I think it is reasonable to wait another 1-2 weeks to see if he has any improvement with PPI.  Discussed strict reasons to return to care including development of chest pain, shortness of breath, or any worsening of symptoms.  If symptoms do not improve with PPI, and above workup negative, would consider right upper quadrant ultrasound to rule out cholelithiasis.   [name]. [name], MD 10/09/2017 11:22 AM

## 2017-10-09 NOTE — Patient Instructions (Signed)
Stop the glipizide.  Start omeprazole.  Please let me know if your abdominal discomfort does not improve. We will need to check blood work.  Otherwise, I will plan on seeing you in 6-9 months, or sooner as needed.   Take care, Dr [name]

## 2017-10-23 ENCOUNTER — Telehealth: Payer: Self-pay | Admitting: Family Medicine

## 2017-10-23 ENCOUNTER — Other Ambulatory Visit: Payer: Self-pay

## 2017-10-23 DIAGNOSIS — R1011 Right upper quadrant pain: Secondary | ICD-10-CM

## 2017-10-23 NOTE — Telephone Encounter (Signed)
Copied from [place] [id number]. Topic: Quick Communication - See Telephone Encounter >> Oct 23, 2017 11:03 AM Ether Griffins B wrote: CRM for notification. See Telephone encounter for: 10/23/17.  Pt's son calling in stating pt is still having discomfort in around the rib area. They are wanting to move forward with having an US done. Son's number is [phone] or email [email or web address]

## 2017-10-23 NOTE — Telephone Encounter (Signed)
Please advise. Ok to order?

## 2017-10-23 NOTE — Telephone Encounter (Signed)
Tyrone with me. Please order RUQ ultrasound.   [name]. [name], MD 10/23/2017 2:13 PM

## 2017-10-30 ENCOUNTER — Ambulatory Visit
Admission: RE | Admit: 2017-10-30 | Discharge: 2017-10-30 | Disposition: A | Discharge status: Home / Self Care | Payer: Self-pay | Source: Ambulatory Visit | Attending: Family Medicine | Admitting: Family Medicine

## 2017-10-30 ENCOUNTER — Telehealth: Payer: Self-pay

## 2017-10-30 ENCOUNTER — Other Ambulatory Visit: Payer: Self-pay

## 2017-10-30 DIAGNOSIS — K8020 Calculus of gallbladder without cholecystitis without obstruction: Secondary | ICD-10-CM

## 2017-10-30 DIAGNOSIS — R1011 Right upper quadrant pain: Secondary | ICD-10-CM

## 2017-10-30 NOTE — Telephone Encounter (Signed)
Pt son calling to check status on getting ultrasound results. Pt son is not on the pts DPR and has been made aware of this but he states he translates for his dad. He states office may have to contact the translator then contact his dad if need be.

## 2017-10-30 NOTE — Telephone Encounter (Signed)
Copied from [place] [phone]. Topic: General - Other >> Oct 30, 2017  2:30 PM Conception Chancy, NT wrote: Reason for CRM: patient son is calling and states he is needing to get the results from the ultrasound. Son is not on the [facility] but states he is the patient translator. He would like a call back. Please advise.   Jun Taff   [phone]

## 2017-10-31 NOTE — Telephone Encounter (Signed)
See note.   Copied from [place] [id number]. Topic: General - Other >> Oct 30, 2017  2:30 PM Conception Chancy, NT wrote: Reason for CRM: patient son is calling and states he is needing to get the results from the ultrasound. Son is not on the [facility] but states he is the patient translator. He would like a call back. Please advise.   Jun Blaydes   [phone] >> Oct 31, 2017  2:03 PM Percell Belt A wrote: Pt son called in, he is not on dpr.  Pt would like to come by and pick up report of the ultrasound to give to his son and he also need to fill out a new DPR to add son so we can speak with him.  Son live in [place].  Sent link to pt and son is going to help him get setup on mychart

## 2017-10-31 NOTE — Telephone Encounter (Signed)
Richard Reeves came into the office today to get his ultrasound results. The patient and I used the interpreter line to help [place]. I printed the results in [place] and [place]. We texted his son to get his MyChart login information.  I also gave pt the interpreter phone number if he has any questions. Patient was very happy with the visit.

## 2018-01-19 ENCOUNTER — Telehealth: Payer: Self-pay | Admitting: Family Medicine

## 2018-01-19 ENCOUNTER — Other Ambulatory Visit: Payer: Self-pay

## 2018-01-19 MED ORDER — PRAVASTATIN SODIUM 20 MG PO TABS: 20.0000 mg | ORAL_TABLET | Freq: Every day | ORAL | 0 refills | Status: DC

## 2018-01-19 NOTE — Telephone Encounter (Signed)
Copied from [place] [id number]. Topic: Quick Communication - Rx Refill/Question >> Jan 19, 2018 12:13 PM Oliver Pila B wrote: Medication: pravastatin (PRAVACHOL) 20 MG tablet [[id number]]   Has the patient contacted their pharmacy? Yes.   (Agent: If no, request that the patient contact the pharmacy for the refill.) (Agent: If yes, when and what did the pharmacy advise?)  Preferred Pharmacy (with phone number or street name): [facility]: Please be advised that RX refills may take up to 3 business days. We ask that you follow-up with your pharmacy.

## 2018-07-24 ENCOUNTER — Encounter: Payer: Self-pay | Admitting: Family Medicine

## 2018-07-24 ENCOUNTER — Ambulatory Visit (INDEPENDENT_AMBULATORY_CARE_PROVIDER_SITE_OTHER): Payer: Medicare HMO | Admitting: Family Medicine

## 2018-07-24 VITALS — BP 132/80 | HR 66 | Temp 97.9°F | Ht 68.0 in | Wt 154.6 lb

## 2018-07-24 DIAGNOSIS — Z1211 Encounter for screening for malignant neoplasm of colon: Secondary | ICD-10-CM | POA: Diagnosis not present

## 2018-07-24 DIAGNOSIS — E1169 Type 2 diabetes mellitus with other specified complication: Secondary | ICD-10-CM

## 2018-07-24 DIAGNOSIS — E119 Type 2 diabetes mellitus without complications: Secondary | ICD-10-CM

## 2018-07-24 DIAGNOSIS — Z23 Encounter for immunization: Secondary | ICD-10-CM

## 2018-07-24 DIAGNOSIS — Z125 Encounter for screening for malignant neoplasm of prostate: Secondary | ICD-10-CM

## 2018-07-24 DIAGNOSIS — M7989 Other specified soft tissue disorders: Secondary | ICD-10-CM

## 2018-07-24 DIAGNOSIS — Z114 Encounter for screening for human immunodeficiency virus [HIV]: Secondary | ICD-10-CM

## 2018-07-24 DIAGNOSIS — E785 Hyperlipidemia, unspecified: Secondary | ICD-10-CM | POA: Diagnosis not present

## 2018-07-24 DIAGNOSIS — Z1159 Encounter for screening for other viral diseases: Secondary | ICD-10-CM | POA: Diagnosis not present

## 2018-07-24 DIAGNOSIS — K8050 Calculus of bile duct without cholangitis or cholecystitis without obstruction: Secondary | ICD-10-CM | POA: Insufficient documentation

## 2018-07-24 DIAGNOSIS — Z0001 Encounter for general adult medical examination with abnormal findings: Secondary | ICD-10-CM

## 2018-07-24 DIAGNOSIS — R69 Illness, unspecified: Secondary | ICD-10-CM | POA: Diagnosis not present

## 2018-07-24 LAB — COMPREHENSIVE METABOLIC PANEL
ALK PHOS: 68 U/L (ref 39–117)
ALT: 27 U/L (ref 0–53)
AST: 43 U/L — AB (ref 0–37)
Albumin: 4.6 g/dL (ref 3.5–5.2)
BILIRUBIN TOTAL: 2 mg/dL — AB (ref 0.2–1.2)
BUN: 14 mg/dL (ref 6–23)
CALCIUM: 9.7 mg/dL (ref 8.4–10.5)
CO2: 30 meq/L (ref 19–32)
CREATININE: 0.83 mg/dL (ref 0.40–1.50)
Chloride: 103 mEq/L (ref 96–112)
GFR: 98.78 mL/min (ref >60.00)
GLUCOSE: 102 mg/dL — AB (ref 70–99)
Potassium: 3.9 mEq/L (ref 3.5–5.1)
Sodium: 140 mEq/L (ref 135–145)
TOTAL PROTEIN: 7.1 g/dL (ref 6.0–8.3)

## 2018-07-24 LAB — CBC
HCT: 39.2 % (ref 39.0–52.0)
HEMOGLOBIN: 13.5 g/dL (ref 13.0–17.0)
MCHC: 34.5 g/dL (ref 30.0–36.0)
MCV: 89.8 fl (ref 78.0–100.0)
PLATELETS: 213 10*3/uL (ref 150.0–400.0)
RBC: 4.36 Mil/uL (ref 4.22–5.81)
RDW: 13 % (ref 11.5–15.5)
WBC: 3.1 10*3/uL — AB (ref 4.0–10.5)

## 2018-07-24 LAB — LIPID PANEL
CHOL/HDL RATIO: 3
Cholesterol: 140 mg/dL (ref 0–200)
HDL: 40.9 mg/dL (ref >39.00)
LDL CALC: 66 mg/dL (ref 0–99)
NonHDL: 98.93
TRIGLYCERIDES: 164 mg/dL — AB (ref 0.0–149.0)
VLDL: 32.8 mg/dL (ref 0.0–40.0)

## 2018-07-24 LAB — MICROALBUMIN / CREATININE URINE RATIO
Creatinine,U: 90.4 mg/dL
MICROALB UR: 1.5 mg/dL (ref 0.0–1.9)
Microalb Creat Ratio: 1.7 mg/g (ref 0.0–30.0)

## 2018-07-24 LAB — TSH: TSH: 3.19 u[IU]/mL (ref 0.35–4.50)

## 2018-07-24 LAB — HEMOGLOBIN A1C: Hgb A1c MFr Bld: 7.1 % — ABNORMAL HIGH (ref 4.6–6.5)

## 2018-07-24 MED ORDER — OMEPRAZOLE 40 MG PO CPDR: 40.0000 mg | DELAYED_RELEASE_CAPSULE | Freq: Every day | ORAL | 3 refills | Status: DC

## 2018-07-24 MED ORDER — PRAVASTATIN SODIUM 20 MG PO TABS: 20.0000 mg | ORAL_TABLET | Freq: Every day | ORAL | 3 refills | Status: DC

## 2018-07-24 MED ORDER — METFORMIN HCL 1000 MG PO TABS: 1000.0000 mg | ORAL_TABLET | Freq: Two times a day (BID) | ORAL | 3 refills | Status: DC

## 2018-07-24 NOTE — Assessment & Plan Note (Signed)
Referral placed to surgery 

## 2018-07-24 NOTE — Progress Notes (Signed)
Subjective:  Javohn Basey is a 68 y.o. male who presents today for his annual comprehensive physical exam.  History provided by patient via [place] interpretor.   HPI:  He has no acute complaints today.   He has a few chronic problems outlined below  # RUQ Abdominal Pain -Last evaluated for this about 7 months ago.  Had ultrasound which confirmed gallstones.  He has not yet followed up with surgery.  Pain is been stable over the last few months.  Worse with eating.  # T2DM -Currently on metformin 1000 mg twice daily and tolerating well.  #GERD -On Prilosec 40 mg daily tolerating well.  #HLD -On pravastatin 20 mg daily and tolerating well.  Lifestyle Diet: No specific diets. Exercise: No specific exercises.  Depression screen PHQ 2/9 07/10/2017  Decreased Interest 0  Down, Depressed, Hopeless 0  PHQ - 2 Score 0   Health Maintenance Due  Topic Date Due  . Hepatitis C Screening  1950/12/22  . FOOT EXAM  06/03/1963  . OPHTHALMOLOGY EXAM  06/03/1963  . HIV Screening  06/02/1968  . HEMOGLOBIN A1C  04/11/2018  . URINE MICROALBUMIN  06/21/2018     ROS: Per HPI, otherwise a complete review of systems was negative.   PMH:  The following were reviewed and entered/updated in epic: Past Medical History:  Diagnosis Date  . Diabetes mellitus without complication ([place])   . Hyperlipidemia    Patient Active Problem List   Diagnosis Date Noted  . Biliary colic 33/29/5188  . Type 2 diabetes mellitus without complication, without long-term current use of insulin ([place]) 07/10/2017  . Hyperlipidemia associated with type 2 diabetes mellitus ([place]) 07/10/2017  . Hyperbilirubinemia 07/10/2017  . Swelling of lower extremity 07/10/2017   History reviewed. No pertinent surgical history.  Family History  Problem Relation Age of Onset  . Cancer Mother     Medications- reviewed and updated Current Outpatient Medications  Medication Sig Dispense Refill  . aspirin EC 81 MG tablet Take 81  mg by mouth daily.    . metFORMIN (GLUCOPHAGE) 1000 MG tablet Take 1 tablet (1,000 mg total) by mouth 2 (two) times daily with a meal. 180 tablet 3  . omeprazole (PRILOSEC) 40 MG capsule Take 1 capsule (40 mg total) by mouth daily. 90 capsule 3  . pravastatin (PRAVACHOL) 20 MG tablet Take 1 tablet (20 mg total) by mouth daily. 90 tablet 3   No current facility-administered medications for this visit.     Allergies-reviewed and updated No Known Allergies  Social History   Socioeconomic History  . Marital status: Married    Spouse name: Not on file  . Number of children: Not on file  . Years of education: Not on file  . Highest education level: Not on file  Occupational History  . Not on file  Social Needs  . Financial resource strain: Not on file  . Food insecurity:    Worry: Not on file    Inability: Not on file  . Transportation needs:    Medical: Not on file    Non-medical: Not on file  Tobacco Use  . Smoking status: Former Research scientist (life sciences)  . Smokeless tobacco: Never Used  Substance and Sexual Activity  . Alcohol use: No    Alcohol/week: 0.0 standard drinks    Frequency: Never    Comment: Occasonally  . Drug use: No  . Sexual activity: Yes    Partners: Female  Lifestyle  . Physical activity:    Days per week:  Not on file    Minutes per session: Not on file  . Stress: Not on file  Relationships  . Social connections:    Talks on phone: Not on file    Gets together: Not on file    Attends religious service: Not on file    Active member of club or organization: Not on file    Attends meetings of clubs or organizations: Not on file    Relationship status: Not on file  Other Topics Concern  . Not on file  Social History Narrative  . Not on file    Objective:  Physical Exam: BP 132/80 (BP Location: Left Arm, Patient Position: Sitting, Cuff Size: Normal)   Pulse 66   Temp 97.9 F (36.6 C) (Oral)   Ht 5\' 8"  (1.727 m)   Wt 154 lb 9.6 oz (70.1 kg)   SpO2 98%   BMI  23.51 kg/m   Body mass index is 23.51 kg/m. Wt Readings from Last 3 Encounters:  07/24/18 154 lb 9.6 oz (70.1 kg)  10/09/17 157 lb (71.2 kg)  07/10/17 160 lb 9.6 oz (72.8 kg)   Gen: NAD, resting comfortably HEENT: TMs normal bilaterally. OP clear. No thyromegaly noted.  CV: RRR with no murmurs appreciated Pulm: NWOB, CTAB with no crackles, wheezes, or rhonchi GI: Normal bowel sounds present. Soft, Nontender, Nondistended. MSK: no edema, cyanosis, or clubbing noted Skin: warm, dry Neuro: CN2-12 grossly intact. Strength 5/5 in upper and lower extremities. Reflexes symmetric and intact bilaterally.  Psych: Normal affect and thought content  Assessment/Plan:  Type 2 diabetes mellitus without complication, without long-term current use of insulin (HCC) Continue metformin 1000 mg twice daily.  Check A1c today.  Check urine microalbumin.  Hyperlipidemia associated with type 2 diabetes mellitus (HCC) Check lipid panel.  Continue pravastatin 20 mg daily.  Biliary colic Referral placed to surgery.  Swelling of lower extremity Secondary to venous insufficiency.  Recommended feet elevation and compression stockings.  Check CBC, CMP, and TSH.  Preventative Healthcare: Check lipid panel, PSA, hep C, and HIV.  Flu vaccine and Prevnar 13 given today as well.  Referral placed to GI for colon cancer screening.  Patient Counseling(The following topics were reviewed and/or handout was given):  -Nutrition: Stressed importance of moderation in sodium/caffeine intake, saturated fat and cholesterol, caloric balance, sufficient intake of fresh fruits, vegetables, and fiber.  -Stressed the importance of regular exercise.   -Substance Abuse: Discussed cessation/primary prevention of tobacco, alcohol, or other drug use; driving or other dangerous activities under the influence; availability of treatment for abuse.   -Injury prevention: Discussed safety belts, safety helmets, smoke detector, smoking  near bedding or upholstery.   -Sexuality: Discussed sexually transmitted diseases, partner selection, use of condoms, avoidance of unintended pregnancy and contraceptive alternatives.   -Dental health: Discussed importance of regular tooth brushing, flossing, and dental visits.  -Health maintenance and immunizations reviewed. Please refer to Health maintenance section.  Return to care in 1 year for next preventative visit.   [name]. [name], MD 07/24/2018 12:10 PM

## 2018-07-24 NOTE — Assessment & Plan Note (Addendum)
Continue metformin 1000 mg twice daily.  Check A1c today.  Check urine microalbumin.

## 2018-07-24 NOTE — Assessment & Plan Note (Addendum)
Secondary to venous insufficiency.  Recommended feet elevation and compression stockings.  Check CBC, CMP, and TSH.

## 2018-07-24 NOTE — Assessment & Plan Note (Signed)
Check lipid panel.  Continue pravastatin 20 mg daily. 

## 2018-07-24 NOTE — Patient Instructions (Signed)
It was very nice to see you today!  I will place you to see a surgeon for your gallstones.  I think this is because about your symptoms.  I also place referral for you to see a GI doctor to talk about colonoscopy and endoscopic.  We will give you a flu shot today and a pneumonia vaccine.  Please call your insurance about getting a shingles vaccine.  Please use compression stockings your legs.  We will check blood work today and a urine sample.  I will refill all of your medications today.  Come back to see me in 3 to 6 months, or sooner as needed.  Take care, Dr [name]

## 2018-07-25 LAB — HEPATITIS C ANTIBODY
HEP C AB: NONREACTIVE
SIGNAL TO CUT-OFF: 0.02 (ref <1.00)

## 2018-07-27 NOTE — Progress Notes (Signed)
Please inform patient of the following:  His sugar is up just a little bit, but blood work is otherwise all stable. Do not need to make any changes to his treatment plan at this time. Recommend following up in 3-6 months to follow up on blood sugar.  Richard Reeves. [name], MD 07/27/2018 8:07 AM

## 2018-08-21 DIAGNOSIS — K8020 Calculus of gallbladder without cholecystitis without obstruction: Secondary | ICD-10-CM | POA: Diagnosis not present

## 2018-08-30 ENCOUNTER — Other Ambulatory Visit (INDEPENDENT_AMBULATORY_CARE_PROVIDER_SITE_OTHER): Payer: Medicare HMO

## 2018-08-30 ENCOUNTER — Ambulatory Visit: Payer: Medicare HMO | Admitting: Gastroenterology

## 2018-08-30 ENCOUNTER — Encounter: Payer: Self-pay | Admitting: Gastroenterology

## 2018-08-30 VITALS — BP 132/76 | HR 74 | Ht 67.5 in | Wt 158.0 lb

## 2018-08-30 DIAGNOSIS — R945 Abnormal results of liver function studies: Secondary | ICD-10-CM | POA: Diagnosis not present

## 2018-08-30 DIAGNOSIS — K760 Fatty (change of) liver, not elsewhere classified: Secondary | ICD-10-CM | POA: Diagnosis not present

## 2018-08-30 DIAGNOSIS — Z86010 Personal history of colonic polyps: Secondary | ICD-10-CM | POA: Diagnosis not present

## 2018-08-30 DIAGNOSIS — R1013 Epigastric pain: Secondary | ICD-10-CM

## 2018-08-30 DIAGNOSIS — K219 Gastro-esophageal reflux disease without esophagitis: Secondary | ICD-10-CM | POA: Diagnosis not present

## 2018-08-30 DIAGNOSIS — R7989 Other specified abnormal findings of blood chemistry: Secondary | ICD-10-CM

## 2018-08-30 DIAGNOSIS — K8020 Calculus of gallbladder without cholecystitis without obstruction: Secondary | ICD-10-CM

## 2018-08-30 DIAGNOSIS — R194 Change in bowel habit: Secondary | ICD-10-CM

## 2018-08-30 LAB — HEPATIC FUNCTION PANEL
ALK PHOS: 68 U/L (ref 39–117)
ALT: 15 U/L (ref 0–53)
AST: 24 U/L (ref 0–37)
Albumin: 4.9 g/dL (ref 3.5–5.2)
Bilirubin, Direct: 0.3 mg/dL (ref 0.0–0.3)
Total Bilirubin: 2 mg/dL — ABNORMAL HIGH (ref 0.2–1.2)
Total Protein: 7.6 g/dL (ref 6.0–8.3)

## 2018-08-30 LAB — AMYLASE: Amylase: 87 U/L (ref 27–131)

## 2018-08-30 LAB — IBC PANEL
IRON: 82 ug/dL (ref 42–165)
SATURATION RATIOS: 21.1 % (ref 20.0–50.0)
Transferrin: 277 mg/dL (ref 212.0–360.0)

## 2018-08-30 LAB — PROTIME-INR
INR: 1 ratio (ref 0.8–1.0)
Prothrombin Time: 11.2 s (ref 9.6–13.1)

## 2018-08-30 LAB — CK: Total CK: 68 U/L (ref 7–232)

## 2018-08-30 LAB — LIPASE: LIPASE: 36 U/L (ref 11.0–59.0)

## 2018-08-30 LAB — IGA: IgA: 268 mg/dL (ref 68–378)

## 2018-08-30 LAB — FERRITIN: Ferritin: 37.5 ng/mL (ref 22.0–322.0)

## 2018-08-30 LAB — BILIRUBIN, DIRECT: BILIRUBIN DIRECT: 0.3 mg/dL (ref 0.0–0.3)

## 2018-08-30 MED ORDER — PEG 3350-KCL-NA BICARB-NACL 420 G PO SOLR: 4000.0000 mL | Freq: Once | ORAL | 0 refills | Status: AC

## 2018-08-30 NOTE — Patient Instructions (Addendum)
You have been scheduled for an endoscopy and colonoscopy. Please follow the written instructions given to you at your visit today. Please pick up your prep supplies at the pharmacy within the next 1-3 days. If you use inhalers (even only as needed), please bring them with you on the day of your procedure. Your physician has requested that you go to www.startemmi.com and enter the access code given to you at your visit today. This web site gives a general overview about your procedure. However, you should still follow specific instructions given to you by our office regarding your preparation for the procedure.   If you are age 55 or older, your body mass index should be between 23-30. Your Body mass index is 24.38 kg/m. If this is out of the aforementioned range listed, please consider follow up with your Primary Care Provider.  If you are age 55 or younger, your body mass index should be between 19-25. Your Body mass index is 24.38 kg/m. If this is out of the aformentioned range listed, please consider follow up with your Primary Care Provider.    Your provider has requested that you go to the basement level for lab work before leaving today. Press "B" on the elevator. The lab is located at the first door on the left as you exit the elevator.   Thank you for choosing me and [facility].  Dr. Rush Landmark

## 2018-08-30 NOTE — Progress Notes (Signed)
[facility] VISIT   Primary Care Provider Vivi Barrack, [place] [address] [phone]  Referring Provider Vivi Barrack, [place], [place] [address] [phone]  Patient Profile: Richard Reeves is a 68 y.o. male with a pmh significant for diabetes, hyperlipidemia, gallstones.  The patient presents to the [facility] for an evaluation and management of problem(s) noted below:  Problem List 1. Abnormal LFTs   2. Fatty liver   3. Gallstones   4. Epigastric discomfort   5. Gastroesophageal reflux disease, esophagitis presence not specified   6. Change in bowel habits   7. History of colonic polyps     History of Present Illness: This is the patient's first visit to the outpatient [facility] clinic.  An interpreter is used for our discussion today.  The patient states for the last year to 2 years that he has experienced issues with anorexia and decreased appetite.  He has not had overt weight loss however.  He describes multiple GI symptoms as well for which he has been evaluated by surgery (we do not have the records) but states that they are planning a possible gallbladder resection.  He has been told and has imaging that suggests he has gallstones.  He describes in 2013 undergoing a colonoscopy back in [place] and was told he had polyps and would need follow-up.  In 2016 he underwent an endoscopy in [place] with report of inflammation being found but no infection.  He may have had an ulcer but that is not completely clear to the patient as well and he does not have access to those records.  He has been told in the past that he has fatty liver that is based on his recent ultrasound in 2019.  The patient was prescribed omeprazole by his primary care provider but stopped taking it after his dosing ran out and did not get a refill.  He felt that it made no difference in his symptoms.  His abdominal discomfort occurs mostly  after he eats.  He does have heartburn as well.  He has no dysphagia or odynophagia.  A few times per month he will have diarrhea and then it will go away and go back to normal bowel movements.  Having bowel movements does not improve his abdominal discomfort.  He has gas at times and feels bloated and wishes that would improve.  He states that he was previously vaccinated against hepatitis B but states that it is not clear he got the true immunization at the end.  He does not take nonsteroidals or BC/Goody powders.  GI Review of Systems Positive as above including infrequent nausea Negative for vomiting, melena, hematochezia  Review of Systems General: Denies fevers/chills/weight loss HEENT: Denies oral lesions Cardiovascular: Denies chest pain Pulmonary: Denies shortness of breath Gastroenterological: See HPI Genitourinary: Denies darkened urine Hematological: Denies easy bruising/bleeding Endocrine: Denies temperature intolerance Dermatological: Denies jaundice Psychological: Mood is stable   Medications Current Outpatient Medications  Medication Sig Dispense Refill  . aspirin EC 81 MG tablet Take 81 mg by mouth daily.    . metFORMIN (GLUCOPHAGE) 1000 MG tablet Take 1 tablet (1,000 mg total) by mouth 2 (two) times daily with a meal. 180 tablet 3  . omeprazole (PRILOSEC) 40 MG capsule Take 1 capsule (40 mg total) by mouth daily. 90 capsule 3  . pravastatin (PRAVACHOL) 20 MG tablet Take 1 tablet (20 mg total) by mouth daily. 90 tablet 3  .  bismuth-metronidazole-tetracycline (PYLERA) 140-125-125 MG capsule Take 3 capsules by mouth 4 (four) times daily -  before meals and at bedtime for 14 days. 168 capsule 0   No current facility-administered medications for this visit.    Allergies No Known Allergies  Histories Past Medical History:  Diagnosis Date  . Diabetes mellitus without complication ([facility])   . Hyperlipidemia    History reviewed. No pertinent surgical history. Social  History   Socioeconomic History  . Marital status: Married    Spouse name: Not on file  . Number of children: Not on file  . Years of education: Not on file  . Highest education level: Not on file  Occupational History  . Not on file  Social Needs  . Financial resource strain: Not on file  . Food insecurity:    Worry: Not on file    Inability: Not on file  . Transportation needs:    Medical: Not on file    Non-medical: Not on file  Tobacco Use  . Smoking status: Former Research scientist (life sciences)  . Smokeless tobacco: Never Used  Substance and Sexual Activity  . Alcohol use: No    Alcohol/week: 0.0 standard drinks    Frequency: Never    Comment: Occasonally  . Drug use: No  . Sexual activity: Yes    Partners: Female  Lifestyle  . Physical activity:    Days per week: Not on file    Minutes per session: Not on file  . Stress: Not on file  Relationships  . Social connections:    Talks on phone: Not on file    Gets together: Not on file    Attends religious service: Not on file    Active member of club or organization: Not on file    Attends meetings of clubs or organizations: Not on file    Relationship status: Not on file  . Intimate partner violence:    Fear of current or ex partner: Not on file    Emotionally abused: Not on file    Physically abused: Not on file    Forced sexual activity: Not on file  Other Topics Concern  . Not on file  Social History Narrative  . Not on file   Family History  Problem Relation Age of Onset  . Cancer Mother   . Colon cancer Neg Hx   . Esophageal cancer Neg Hx   . Inflammatory bowel disease Neg Hx   . Liver disease Neg Hx   . Pancreatic cancer Neg Hx   . Rectal cancer Neg Hx   . Stomach cancer Neg Hx    I have reviewed his medical, social, and family history in detail and updated the electronic medical record as necessary.    PHYSICAL EXAMINATION  BP 132/76   Pulse 74   Ht 5' 7.5" (1.715 m)   Wt 158 lb (71.7 kg)   BMI 24.38 kg/m  Wt  Readings from Last 3 Encounters:  08/30/18 158 lb (71.7 kg)  07/24/18 154 lb 9.6 oz (70.1 kg)  10/09/17 157 lb (71.2 kg)  GEN: NAD, appears stated age, doesn't appear chronically ill PSYCH: Cooperative, without pressured speech EYE: Conjunctivae pink, sclerae anicteric ENT: MMM, without oral ulcers, no erythema or exudates noted NECK: Supple CV: RR without R/Gs  RESP: CTAB posteriorly, without wheezing GI: NABS, soft, NT/ND, without rebound or guarding, no HSM appreciated GU: DRE shows MSK/EXT: No lower extremity edema SKIN: No jaundice, no spider angiomata NEURO:  Alert & Oriented x 3,  no focal deficits, no evidence of asterixis   REVIEW OF DATA  I reviewed the following data at the time of this encounter:  GI Procedures and Studies  Reported 2013 colonoscopy Reported polyps  Reported 2016 endoscopy Reported inflammation and possible ulcer but states no biopsies were performed  Laboratory Studies  Reviewed in epic  Imaging Studies  April 2019 right upper quadrant ultrasound IMPRESSION: Gallstones without ultrasound findings of acute cholecystitis. Liver of increased echogenicity consistent with fatty infiltration and/or hepatocellular disease. Probable small area of focal fatty sparing adjacent to the gallbladder fossa.   ASSESSMENT  Richard Reeves is a 68 y.o. male with a pmh significant for The patient is seen today for evaluation and management of:  1. Abnormal LFTs   2. Fatty liver   3. Gallstones   4. Epigastric discomfort   5. Gastroesophageal reflux disease, esophagitis presence not specified   6. Change in bowel habits   7. History of colonic polyps    The patient is hemodynamically stable but has multiple symptoms from a GI perspective.  I suspect he has some underlying GERD.  His abdominal discomfort actually sounds like he may have an H. pylori infection or gastritis and as he is previously described having "inflammation in his stomach" I do wonder about this.   We should evaluate for this.  He does have gallstones and reportedly has been seen by surgery with plans for possible cholecystectomy but was told that an upper endoscopy and colonoscopy should be completed before to ensure nothing else was occurring.  We will try and obtain the [place] surgery records to evaluate what he is described.  We will initiate the patient on simethicone for gas/bloating.  He is not had an abdominal ultrasound in over a year so we will plan on doing that for now to see if anything has changed from prior.  We will add on an upper endoscopy to his colonoscopy since he is due for that as well.  Further recommendations will be made based on the findings.  The risks and benefits of endoscopic evaluation were discussed with the patient; these include but are not limited to the risk of perforation, infection, bleeding, missed lesions, lack of diagnosis, severe illness requiring hospitalization, as well as anesthesia and sedation related illnesses.  The patient is agreeable to proceed.  All patient questions were answered, to the best of my ability, and the patient agrees to the aforementioned plan of action with follow-up as indicated.   PLAN  Labs as outlined below H. pylori stool antigen Review of his hepatitis serologies should he need potential revaccination Proceed with colonoscopy and upper endoscopy (Esophageal/Gastric/Duodenal/Colon Biopsies) Try to obtain the surgery records Not clear entirely to me that all of this is biliary colic however we will work to [employer] other causes for the patient's symptoms Ideally would restart a PPI but patient hesitant since did not feel that there was effectiveness previously   Orders Placed This Encounter  Procedures  . Helicobacter pylori special antigen  . Amylase  . Lipase  . Hepatic function panel  . Bilirubin, Direct  . IBC panel  . Ferritin  . IgA  . Tissue Transglutaminase Abs,IgG,IgA  . Protime-INR  . Hepatitis A  antibody, total  . Hepatitis B surface antibody,qualitative  . Hepatitis B surface antigen  . CK (Creatine Kinase)  . Ambulatory referral to Gastroenterology    New Prescriptions   BISMUTH-METRONIDAZOLE-TETRACYCLINE [facility]) [phone] MG CAPSULE    Take 3 capsules by mouth 4 (  four) times daily -  before meals and at bedtime for 14 days.   Modified Medications   No medications on file    Planned Follow Up: No follow-ups on file.   Justice Britain, MD [facility]oscopy Office # [phone]

## 2018-08-31 LAB — HELICOBACTER PYLORI  SPECIAL ANTIGEN
MICRO NUMBER: 191817
RESULT:: DETECTED — AB
SPECIMEN QUALITY: ADEQUATE

## 2018-09-03 ENCOUNTER — Other Ambulatory Visit: Payer: Self-pay

## 2018-09-03 LAB — HEPATITIS B SURFACE ANTIBODY,QUALITATIVE: Hep B S Ab: BORDERLINE — AB

## 2018-09-03 LAB — HEPATITIS B SURFACE ANTIGEN: Hepatitis B Surface Ag: NONREACTIVE

## 2018-09-03 LAB — HEPATITIS A ANTIBODY, TOTAL: Hepatitis A AB,Total: REACTIVE — AB

## 2018-09-03 LAB — TISSUE TRANSGLUTAMINASE ABS,IGG,IGA
(TTG) AB, IGA: 1 U/mL
(tTG) Ab, IgG: 2 U/mL

## 2018-09-03 MED ORDER — BIS SUBCIT-METRONID-TETRACYC 140-125-125 MG PO CAPS: 3.0000 | ORAL_CAPSULE | Freq: Three times a day (TID) | ORAL | 0 refills | Status: DC

## 2018-09-04 ENCOUNTER — Telehealth: Payer: Self-pay | Admitting: Gastroenterology

## 2018-09-04 DIAGNOSIS — Z860100 Personal history of colon polyps, unspecified: Secondary | ICD-10-CM | POA: Insufficient documentation

## 2018-09-04 DIAGNOSIS — R945 Abnormal results of liver function studies: Secondary | ICD-10-CM | POA: Insufficient documentation

## 2018-09-04 DIAGNOSIS — R7989 Other specified abnormal findings of blood chemistry: Secondary | ICD-10-CM | POA: Insufficient documentation

## 2018-09-04 DIAGNOSIS — K219 Gastro-esophageal reflux disease without esophagitis: Secondary | ICD-10-CM | POA: Insufficient documentation

## 2018-09-04 DIAGNOSIS — K8020 Calculus of gallbladder without cholecystitis without obstruction: Secondary | ICD-10-CM | POA: Insufficient documentation

## 2018-09-04 DIAGNOSIS — R1013 Epigastric pain: Secondary | ICD-10-CM | POA: Insufficient documentation

## 2018-09-04 DIAGNOSIS — R194 Change in bowel habit: Secondary | ICD-10-CM | POA: Insufficient documentation

## 2018-09-04 DIAGNOSIS — K760 Fatty (change of) liver, not elsewhere classified: Secondary | ICD-10-CM | POA: Insufficient documentation

## 2018-09-04 MED ORDER — TETRACYCLINE HCL 500 MG PO CAPS: 500.0000 mg | ORAL_CAPSULE | Freq: Four times a day (QID) | ORAL | 0 refills | Status: AC

## 2018-09-04 MED ORDER — METRONIDAZOLE 250 MG PO TABS: 500.0000 mg | ORAL_TABLET | Freq: Four times a day (QID) | ORAL | 0 refills | Status: AC

## 2018-09-04 MED ORDER — BISMUTH SUBSALICYLATE 262 MG PO TABS: 2.0000 | ORAL_TABLET | Freq: Four times a day (QID) | ORAL | 0 refills | Status: AC

## 2018-09-04 NOTE — Telephone Encounter (Signed)
Individual components sent to the pt pharmacy.

## 2018-09-04 NOTE — Telephone Encounter (Signed)
Pharmacy calling to inform that copay for pylera is $300. Pt is asking if she can have something more affordable.

## 2018-09-05 ENCOUNTER — Telehealth: Payer: Self-pay | Admitting: Gastroenterology

## 2018-09-05 NOTE — Telephone Encounter (Signed)
Pt daughter called in and stated that the medication that was called in yesterday expensive with insurance and wants to know if something else can be ordered. She did not know the name of the medication

## 2018-09-06 ENCOUNTER — Other Ambulatory Visit: Payer: Self-pay

## 2018-09-06 ENCOUNTER — Encounter: Payer: Self-pay | Admitting: Gastroenterology

## 2018-09-06 NOTE — Progress Notes (Signed)
Documentation of outside records  [facility] clinic visit from August 21, 2018 Evaluated by Dr. Marlou Starks. Assessment/plan per Dr. cough is that the patient appears to have symptomatic gallstones..  Plan for cholecystectomy.  However the patient needs to have an upper and lower endoscopy before scheduling surgery in case there are other things that need to be dealt with at the same time.  We will have these records scanned into the chart

## 2018-10-11 ENCOUNTER — Telehealth: Payer: Self-pay | Admitting: *Deleted

## 2018-10-11 NOTE — Telephone Encounter (Signed)
Daughter phoned back and pt is still reporting episodes of pain and would like to proceed with procedure as scheduled.

## 2018-10-11 NOTE — Telephone Encounter (Signed)
Daughter Kin called back and is going to call her father to see how he is feeling and let us know.

## 2018-10-11 NOTE — Telephone Encounter (Signed)
Left message for patient to call back regarding appointment scheduled 10/18/18. Pt is listed as needing interpreter though DPR states we can speak with children.

## 2018-10-17 ENCOUNTER — Telehealth: Payer: Self-pay

## 2018-10-17 NOTE — Telephone Encounter (Addendum)
Covid-19 travel screening questions  Have you traveled in the last 14 days?No.  If yes where?  Do you now or have you had a fever in the last 14 days? No.   Do you have any respiratory symptoms of shortness of breath or cough now or in the last 14 days? No.   Do you have a medical history of Congestive Heart Failure?  Do you have a medical history of lung disease?  Do you have any family members or close contacts with diagnosed or suspected Covid-19? No.       Left message at 11:30 for patient or for his son/ daughter to call me back regarding covid-19 screening questions. Also explained on voicemail that the care partner would remain in their car in the parking lot during the procedure.    Patient daughter called back and we went over screening questions. Also informed her about our "No care partner in the lobby" policy. She agreed and had no further questions.

## 2018-10-18 ENCOUNTER — Ambulatory Visit (AMBULATORY_SURGERY_CENTER): Payer: Medicare HMO | Admitting: Internal Medicine

## 2018-10-18 ENCOUNTER — Encounter: Payer: Self-pay | Admitting: Internal Medicine

## 2018-10-18 ENCOUNTER — Telehealth: Payer: Self-pay | Admitting: Internal Medicine

## 2018-10-18 ENCOUNTER — Other Ambulatory Visit: Payer: Self-pay

## 2018-10-18 VITALS — BP 138/78 | HR 59 | Temp 98.6°F | Resp 11 | Ht 67.5 in | Wt 158.0 lb

## 2018-10-18 DIAGNOSIS — K3189 Other diseases of stomach and duodenum: Secondary | ICD-10-CM

## 2018-10-18 DIAGNOSIS — R194 Change in bowel habit: Secondary | ICD-10-CM

## 2018-10-18 DIAGNOSIS — R1013 Epigastric pain: Secondary | ICD-10-CM

## 2018-10-18 DIAGNOSIS — K209 Esophagitis, unspecified: Secondary | ICD-10-CM | POA: Diagnosis not present

## 2018-10-18 DIAGNOSIS — R7989 Other specified abnormal findings of blood chemistry: Secondary | ICD-10-CM

## 2018-10-18 DIAGNOSIS — K2970 Gastritis, unspecified, without bleeding: Secondary | ICD-10-CM

## 2018-10-18 DIAGNOSIS — Z86010 Personal history of colonic polyps: Secondary | ICD-10-CM | POA: Diagnosis not present

## 2018-10-18 DIAGNOSIS — D123 Benign neoplasm of transverse colon: Secondary | ICD-10-CM | POA: Diagnosis not present

## 2018-10-18 DIAGNOSIS — R945 Abnormal results of liver function studies: Secondary | ICD-10-CM

## 2018-10-18 DIAGNOSIS — K2950 Unspecified chronic gastritis without bleeding: Secondary | ICD-10-CM | POA: Diagnosis not present

## 2018-10-18 DIAGNOSIS — Z8619 Personal history of other infectious and parasitic diseases: Secondary | ICD-10-CM

## 2018-10-18 MED ORDER — SODIUM CHLORIDE 0.9 % IV SOLN: 500.0000 mL | Freq: Once | INTRAVENOUS | Status: DC

## 2018-10-18 NOTE — Op Note (Signed)
[facility] Patient Name: Richard Reeves Procedure Date: 10/18/2018 9:04 AM MRN: [id number] Endoscopist: Jerene Bears , MD Age: 68 Referring MD:  Date of Birth: 1950-08-21 Gender: Male Account #: [id number] Procedure:                Colonoscopy Indications:              High risk colon cancer surveillance: Personal                            history of colonic polyps, Incidental change in                            bowel habits noted Medicines:                Monitored Anesthesia Care Procedure:                Pre-Anesthesia Assessment:                           - Prior to the procedure, a History and Physical                            was performed, and patient medications and                            allergies were reviewed. The patient's tolerance of                            previous anesthesia was also reviewed. The risks                            and benefits of the procedure and the sedation                            options and risks were discussed with the patient.                            All questions were answered, and informed consent                            was obtained. Prior Anticoagulants: The patient has                            taken no previous anticoagulant or antiplatelet                            agents. ASA Grade Assessment: II - A patient with                            mild systemic disease. After reviewing the risks                            and benefits, the patient was deemed in  satisfactory condition to undergo the procedure.                           - Prior to the procedure, a History and Physical                            was performed, and patient medications and                            allergies were reviewed. The patient's tolerance of                            previous anesthesia was also reviewed. The risks                            and benefits of the procedure and the sedation                             options and risks were discussed with the patient.                            All questions were answered, and informed consent                            was obtained. Prior Anticoagulants: The patient has                            taken no previous anticoagulant or antiplatelet                            agents. ASA Grade Assessment: II - A patient with                            mild systemic disease. After reviewing the risks                            and benefits, the patient was deemed in                            satisfactory condition to undergo the procedure.                           After obtaining informed consent, the colonoscope                            was passed under direct vision. Throughout the                            procedure, the patient's blood pressure, pulse, and                            oxygen saturations were monitored continuously. The  Colonoscope was introduced through the anus and                            advanced to the cecum, identified by appendiceal                            orifice and ileocecal valve. The colonoscopy was                            performed without difficulty. The patient tolerated                            the procedure well. The quality of the bowel                            preparation was good. The ileocecal valve,                            appendiceal orifice, and rectum were photographed. Scope In: 9:38:25 AM Scope Out: 9:57:02 AM Scope Withdrawal Time: 0 hours 14 minutes 19 seconds  Total Procedure Duration: 0 hours 18 minutes 37 seconds  Findings:                 The digital rectal exam was normal.                           A 3 mm polyp was found in the transverse colon. The                            polyp was sessile. The polyp was removed with a                            cold snare. Resection and retrieval were complete.                           Multiple small-mouthed  diverticula were found in                            the ascending colon and cecum.                           Internal hemorrhoids were found during                            retroflexion. The hemorrhoids were small.                           The exam was otherwise without abnormality. Complications:            No immediate complications. Estimated Blood Loss:     Estimated blood loss was minimal. Impression:               - One 3 mm polyp in the transverse colon, removed  with a cold snare. Resected and retrieved.                           - Diverticulosis in the ascending colon and in the                            cecum.                           - Small internal hemorrhoids.                           - The examination was otherwise normal. Recommendation:           - Patient has a contact number available for                            emergencies. The signs and symptoms of potential                            delayed complications were discussed with the                            patient. Return to normal activities tomorrow.                            Written discharge instructions were provided to the                            patient.                           - Resume previous diet.                           - Continue present medications.                           - Await pathology results.                           - Repeat colonoscopy is recommended for                            surveillance. The colonoscopy date will be                            determined after pathology results from today's                            exam become available for review.                           - Office follow-up with Dr. Rush Landmark once                            pathology results are available (likely with  virtual visit given COVID-19 pandemic). Jerene Bears, MD 10/18/2018 10:05:54 AM This report has been signed electronically.

## 2018-10-18 NOTE — Telephone Encounter (Signed)
Pt's daughter Bonnita Nasuti called to inform that her father has been feeling a lump like feeling since his proc this morning. They want to know if it is normal. Pls call her.

## 2018-10-18 NOTE — Progress Notes (Signed)
PT taken to PACU. Monitors in place. VSS. Report given to RN. 

## 2018-10-18 NOTE — Op Note (Signed)
[facility] Patient Name: Richard Reeves Procedure Date: 10/18/2018 9:03 AM MRN: [id number] Endoscopist: Jerene Bears , MD Age: 68 Referring MD:  Date of Birth: 02/13/1951 Gender: Male Account #: [id number] Procedure:                Upper GI endoscopy Indications:              Epigastric abdominal pain, Gastro-esophageal reflux                            disease, Previously treated for Helicobacter pylori                            (diagnosed by stool Ag in Jan 2020 and treated with                            Pylera which the patient reports he completed) Medicines:                Monitored Anesthesia Care Procedure:                Pre-Anesthesia Assessment:                           - Prior to the procedure, a History and Physical                            was performed, and patient medications and                            allergies were reviewed. The patient's tolerance of                            previous anesthesia was also reviewed. The risks                            and benefits of the procedure and the sedation                            options and risks were discussed with the patient.                            All questions were answered, and informed consent                            was obtained. Prior Anticoagulants: The patient has                            taken no previous anticoagulant or antiplatelet                            agents. ASA Grade Assessment: II - A patient with                            mild systemic disease. After reviewing the risks  and benefits, the patient was deemed in                            satisfactory condition to undergo the procedure.                           After obtaining informed consent, the endoscope was                            passed under direct vision. Throughout the                            procedure, the patient's blood pressure, pulse, and                            oxygen  saturations were monitored continuously. The                            Model GIF-HQ190 [id number]) scope was introduced                            through the mouth, and advanced to the second part                            of duodenum. The upper GI endoscopy was                            accomplished without difficulty. The patient                            tolerated the procedure well. Scope In: Scope Out: Findings:                 The examined esophagus was normal. Biopsies were                            taken from the proximal and distal esophagus with a                            cold forceps for histology.                           The Z-line was regular and was found 42 cm from the                            incisors.                           Scattered mild inflammation characterized by                            erythema was found in the gastric body and in the                            gastric antrum. Biopsies were taken  with a cold                            forceps for histology and Helicobacter pylori                            testing (fundus, body, antrum and incisura).                           The examined duodenum was normal. Biopsies for                            histology were taken with a cold forceps for                            evaluation of celiac disease. Complications:            No immediate complications. Estimated Blood Loss:     Estimated blood loss was minimal. Impression:               - Normal esophagus. Biopsied.                           - Z-line regular, 42 cm from the incisors.                           - Mild, scattered, gastritis. Biopsied.                           - Normal examined duodenum. Biopsied. Recommendation:           - Patient has a contact number available for                            emergencies. The signs and symptoms of potential                            delayed complications were discussed with the                             patient. Return to normal activities tomorrow.                            Written discharge instructions were provided to the                            patient.                           - Resume previous diet.                           - Continue present medications.                           - Await pathology results.                           -  See the other procedure note for documentation of                            additional recommendations. Jerene Bears, MD 10/18/2018 10:02:33 AM This report has been signed electronically.

## 2018-10-18 NOTE — Telephone Encounter (Signed)
Spoke with pts daughter Laurette Schimke.  She states that her father feels a lump when he swallows.  He is able to swallow food and liquids without difficulty. He states that it is not painful but uncomfortable.  I explained that this was normal after an endoscopy.  I advised his daughter to call back if symptoms worsen.  She verbalized understanding.

## 2018-10-18 NOTE — Patient Instructions (Signed)
YOU HAD AN ENDOSCOPIC PROCEDURE TODAY AT [facility]:   Refer to the procedure report that was given to you for any specific questions about what was found during the examination.  If the procedure report does not answer your questions, please call your gastroenterologist to clarify.  If you requested that your care partner not be given the details of your procedure findings, then the procedure report has been included in a sealed envelope for you to review at your convenience later.  YOU SHOULD EXPECT: Some feelings of bloating in the abdomen. Passage of more gas than usual.  Walking can help get rid of the air that was put into your GI tract during the procedure and reduce the bloating. If you had a lower endoscopy (such as a colonoscopy or flexible sigmoidoscopy) you may notice spotting of blood in your stool or on the toilet paper. If you underwent a bowel prep for your procedure, you may not have a normal bowel movement for a few days.  Please Note:  You might notice some irritation and congestion in your nose or some drainage.  This is from the oxygen used during your procedure.  There is no need for concern and it should clear up in a day or so.  SYMPTOMS TO REPORT IMMEDIATELY:   Following lower endoscopy (colonoscopy or flexible sigmoidoscopy):  Excessive amounts of blood in the stool  Significant tenderness or worsening of abdominal pains  Swelling of the abdomen that is new, acute  Fever of 100F or higher   Following upper endoscopy (EGD)  Vomiting of blood or coffee ground material  New chest pain or pain under the shoulder blades  Painful or persistently difficult swallowing  New shortness of breath  Fever of 100F or higher  Black, tarry-looking stools  For urgent or emergent issues, a gastroenterologist can be reached at any hour by calling [phone].   DIET:  We do recommend a small meal at first, but then you may proceed to your regular diet.  Drink  plenty of fluids but you should avoid alcoholic beverages for 24 hours.  ACTIVITY:  You should plan to take it easy for the rest of today and you should NOT DRIVE or use heavy machinery until tomorrow (because of the sedation medicines used during the test).    FOLLOW UP: Our staff will call the number listed on your records the next business day following your procedure to check on you and address any questions or concerns that you may have regarding the information given to you following your procedure. If we do not reach you, we will leave a message.  However, if you are feeling well and you are not experiencing any problems, there is no need to return our call.  We will assume that you have returned to your regular daily activities without incident.  If any biopsies were taken you will be contacted by phone or by letter within the next 1-3 weeks.  Please call us at [phone] if you have not heard about the biopsies in 3 weeks.    SIGNATURES/CONFIDENTIALITY: You and/or your care partner have signed paperwork which will be entered into your electronic medical record.  These signatures attest to the fact that that the information above on your After Visit Summary has been reviewed and is understood.  Full responsibility of the confidentiality of this discharge information lies with you and/or your care-partner.  All information given via interpreter.

## 2018-10-19 ENCOUNTER — Telehealth: Payer: Self-pay | Admitting: *Deleted

## 2018-10-19 NOTE — Telephone Encounter (Signed)
Attempted to call patient. No answer, number identifier. Message left that we would attempt to call later in the day.

## 2018-10-24 ENCOUNTER — Telehealth: Payer: Self-pay | Admitting: Family Medicine

## 2018-10-24 NOTE — Telephone Encounter (Signed)
Got a call from the daughter, Richard Reeves - Richard Reeves would like to have a face to face visit and not a virtual visit.  He doesn't speak english so coming into the office is easier for him.  He understands the risk.  Daughter voiced understanding about that as well.  They were inquiring about possibly canceling the appointment and just getting refills of his medications.  Please advise and give daughter a call back - [phone].

## 2018-10-24 NOTE — Telephone Encounter (Signed)
Attempted to call patient 2nd time. No answer, left message to call if questions or concerns.

## 2018-10-25 ENCOUNTER — Other Ambulatory Visit: Payer: Self-pay

## 2018-10-25 MED ORDER — METFORMIN HCL 1000 MG PO TABS: 1000.0000 mg | ORAL_TABLET | Freq: Two times a day (BID) | ORAL | 3 refills | Status: DC

## 2018-10-25 NOTE — Telephone Encounter (Signed)
Please advise 

## 2018-10-25 NOTE — Telephone Encounter (Signed)
Rx refill sent to pharmacy. 

## 2018-10-25 NOTE — Telephone Encounter (Signed)
Ok to refill meds and push out appointment 3 months.  [name]. [name], MD 10/25/2018 9:33 AM

## 2018-10-25 NOTE — Telephone Encounter (Signed)
Daughter notified and ok with pushing appointment out 3 months.

## 2018-10-30 ENCOUNTER — Ambulatory Visit: Payer: Medicare HMO | Admitting: Family Medicine

## 2018-11-01 ENCOUNTER — Encounter: Payer: Self-pay | Admitting: Internal Medicine

## 2018-11-05 ENCOUNTER — Telehealth: Payer: Self-pay

## 2018-11-05 NOTE — Telephone Encounter (Signed)
Pt scheduled for 12/13/2018 @ 11:00am.

## 2018-12-07 DIAGNOSIS — R69 Illness, unspecified: Secondary | ICD-10-CM | POA: Diagnosis not present

## 2018-12-11 ENCOUNTER — Ambulatory Visit (INDEPENDENT_AMBULATORY_CARE_PROVIDER_SITE_OTHER): Payer: Medicare HMO | Admitting: Gastroenterology

## 2018-12-11 ENCOUNTER — Encounter: Payer: Self-pay | Admitting: Gastroenterology

## 2018-12-11 ENCOUNTER — Other Ambulatory Visit (INDEPENDENT_AMBULATORY_CARE_PROVIDER_SITE_OTHER): Payer: Medicare HMO

## 2018-12-11 ENCOUNTER — Other Ambulatory Visit: Payer: Self-pay

## 2018-12-11 VITALS — BP 112/67 | HR 73 | Ht 68.0 in | Wt 153.0 lb

## 2018-12-11 DIAGNOSIS — K2950 Unspecified chronic gastritis without bleeding: Secondary | ICD-10-CM

## 2018-12-11 DIAGNOSIS — R945 Abnormal results of liver function studies: Secondary | ICD-10-CM

## 2018-12-11 DIAGNOSIS — R1012 Left upper quadrant pain: Secondary | ICD-10-CM

## 2018-12-11 DIAGNOSIS — K3189 Other diseases of stomach and duodenum: Secondary | ICD-10-CM

## 2018-12-11 DIAGNOSIS — R7989 Other specified abnormal findings of blood chemistry: Secondary | ICD-10-CM

## 2018-12-11 DIAGNOSIS — R1011 Right upper quadrant pain: Secondary | ICD-10-CM | POA: Diagnosis not present

## 2018-12-11 DIAGNOSIS — Z8619 Personal history of other infectious and parasitic diseases: Secondary | ICD-10-CM | POA: Diagnosis not present

## 2018-12-11 DIAGNOSIS — K31A0 Gastric intestinal metaplasia, unspecified: Secondary | ICD-10-CM

## 2018-12-11 DIAGNOSIS — Z23 Encounter for immunization: Secondary | ICD-10-CM

## 2018-12-11 LAB — HEPATIC FUNCTION PANEL
ALT: 12 U/L (ref 0–53)
AST: 22 U/L (ref 0–37)
Albumin: 4.8 g/dL (ref 3.5–5.2)
Alkaline Phosphatase: 64 U/L (ref 39–117)
Bilirubin, Direct: 0.3 mg/dL (ref 0.0–0.3)
Total Bilirubin: 2.2 mg/dL — ABNORMAL HIGH (ref 0.2–1.2)
Total Protein: 7.5 g/dL (ref 6.0–8.3)

## 2018-12-11 MED ORDER — OMEPRAZOLE 20 MG PO CPDR: 20.0000 mg | DELAYED_RELEASE_CAPSULE | Freq: Every day | ORAL | 6 refills | Status: DC

## 2018-12-11 NOTE — Patient Instructions (Addendum)
If you are age 66 or older, your body mass index should be between 23-30. Your Body mass index is 23.26 kg/m. If this is out of the aforementioned range listed, please consider follow up with your Primary Care Provider.  If you are age 52 or younger, your body mass index should be between 19-25. Your Body mass index is 23.26 kg/m. If this is out of the aformentioned range listed, please consider follow up with your Primary Care Provider.   It has been recommended to you by your physician that you have a(n) Endo/LEC in August 2020 completed. We did not schedule the procedure(s) today, but will contact you at a later time to schedule when August schedule is available. Should you not hear from [place], please contact us at  [phone]  We have sent the following medications to your pharmacy for you to pick up at your convenience: Omeprazole   Your provider has requested that you go to the basement level for lab work before leaving today. Press "B" on the elevator. The lab is located at the first door on the left as you exit the elevator.   You were given Hep B #1 today. You will be due for #2 in one month.    Thank you for choosing me and [facility].  Dr. Rush Landmark

## 2018-12-11 NOTE — Progress Notes (Signed)
[facility]ISIT   Primary Care Provider Vivi Barrack, [place] [address] [phone]  Patient Profile: Richard Reeves is a 68 y.o. male with a pmh significant for diabetes, hyperlipidemia, gallstones, history of H. pylori infection, cholelithiasis.  The patient presents to the [facility] for an evaluation and management of problem(s) noted below:  Problem List 1. History of Helicobacter infection   2. Intestinal metaplasia of gastric mucosa   3. Other chronic gastritis without hemorrhage   4. RUQ discomfort   5. Abnormal LFTs   6. Need for prophylactic vaccination and inoculation against viral hepatitis     History of Present Illness Please see initial consultation note for full details of HPI.    Interval History The patient was seen in the office today with an interpreter.  He has completed his H. pylori treatment and had a follow-up endoscopy as well as screening colonoscopy performed by Dr. Hilarie Fredrickson and there was no evidence of persistent H. pylori infection.  He did have intestinal metaplasia on his gastric biopsies as well as chronic gastritis.  He is not taking PPI since completion of his H. pylori treatment.  Overall, the patient has been doing well and is feeling better.  He still has infrequent periods of discomfort in his midepigastric and right upper quadrant region.  However, these are not occurring postprandially at this point in time.  The episodes last a few minutes and then go away.  His weight has been stable.  He is eating normally.  His bowel habits are normal.  There is no melena or hematochezia.  He also wonders whether he should undergo his planned gallbladder resection now or if we can wait on that.  GI Review of Systems Positive as above Negative for nausea, vomiting, bloating, anorexia  Review of Systems General: Denies fevers/chills Cardiovascular: Denies chest pain Pulmonary: Denies shortness  of breath Gastroenterological: See HPI Genitourinary: Denies darkened urine Hematological: Denies easy bruising Dermatological: Denies jaundice Psychological: Mood is stable.   Medications Current Outpatient Medications  Medication Sig Dispense Refill  . aspirin EC 81 MG tablet Take 81 mg by mouth daily.    . metFORMIN (GLUCOPHAGE) 1000 MG tablet Take 1 tablet (1,000 mg total) by mouth 2 (two) times daily with a meal. 180 tablet 3  . omeprazole (PRILOSEC) 20 MG capsule Take 1 capsule (20 mg total) by mouth daily. 30 capsule 6  . pravastatin (PRAVACHOL) 20 MG tablet Take 1 tablet (20 mg total) by mouth daily. 90 tablet 3   No current facility-administered medications for this visit.    Allergies No Known Allergies  Histories Past Medical History:  Diagnosis Date  . Diabetes mellitus without complication ([place])   . Hyperlipidemia    History reviewed. No pertinent surgical history. Social History   Socioeconomic History  . Marital status: Married    Spouse name: Not on file  . Number of children: Not on file  . Years of education: Not on file  . Highest education level: Not on file  Occupational History  . Not on file  Social Needs  . Financial resource strain: Not on file  . Food insecurity:    Worry: Not on file    Inability: Not on file  . Transportation needs:    Medical: Not on file    Non-medical: Not on file  Tobacco Use  . Smoking status: Former Research scientist (life sciences)  . Smokeless tobacco: Never Used  Substance and Sexual Activity  .  Alcohol use: No    Alcohol/week: 0.0 standard drinks    Frequency: Never  . Drug use: No  . Sexual activity: Yes    Partners: Female  Lifestyle  . Physical activity:    Days per week: Not on file    Minutes per session: Not on file  . Stress: Not on file  Relationships  . Social connections:    Talks on phone: Not on file    Gets together: Not on file    Attends religious service: Not on file    Active member of club or organization:  Not on file    Attends meetings of clubs or organizations: Not on file    Relationship status: Not on file  . Intimate partner violence:    Fear of current or ex partner: Not on file    Emotionally abused: Not on file    Physically abused: Not on file    Forced sexual activity: Not on file  Other Topics Concern  . Not on file  Social History Narrative  . Not on file   Family History  Problem Relation Age of Onset  . Cancer Mother   . Colon cancer Neg Hx   . Esophageal cancer Neg Hx   . Inflammatory bowel disease Neg Hx   . Liver disease Neg Hx   . Pancreatic cancer Neg Hx   . Rectal cancer Neg Hx   . Stomach cancer Neg Hx    I have reviewed his medical, social, and family history in detail and updated the electronic medical record as necessary.    PHYSICAL EXAMINATION  BP 112/67   Pulse 73   Ht 5\' 8"  (1.727 m)   Wt 153 lb (69.4 kg)   SpO2 98%   BMI 23.26 kg/m  Wt Readings from Last 3 Encounters:  12/11/18 153 lb (69.4 kg)  10/18/18 158 lb (71.7 kg)  08/30/18 158 lb (71.7 kg)  GEN: NAD, appears stated age, doesn't appear chronically ill PSYCH: Cooperative, without pressured speech EYE: Conjunctivae pink, sclerae anicteric ENT: MMM CV: RR without R/Gs  RESP: Without adventitious sounds or wheezing present GI: NABS, soft, NT/ND, without rebound or guarding, no HSM appreciated MSK/EXT: No lower extremity edema SKIN: No jaundice, no spider angiomata NEURO:  Alert & Oriented x 3, no focal deficits   REVIEW OF DATA  I reviewed the following data at the time of this encounter:  GI Procedures and Studies  April 2020 EGD - Normal esophagus. Biopsied. - Z-line regular, 42 cm from the incisors. - Mild, scattered, gastritis. Biopsied. - Normal examined duodenum. Biopsied.  April 2020 colonoscopy - One 3 mm polyp in the transverse colon, removed with a cold snare. Resected and retrieved. - Diverticulosis in the ascending colon and in the cecum. - Small internal  hemorrhoids.  Diagnosis 1. Surgical [P], duodenal - BENIGN DUODENAL MUCOSA - NO ACUTE INFLAMMATION, VILLOUS BLUNTING OR INCREASED INTRAEPITHELIAL LYMPHOCYTES 2. Surgical [P], gastric antrum and gastric body - MILD CHRONIC GASTRITIS WITH INTESTINAL METAPLASIA - NO H. PYLORI IDENTIFIED - SEE COMMENT 3. Surgical [P], esophagus - REACTIVE SQUAMOUS MUCOSA WITH MINIMAL CHRONIC INFLAMMATION - SEE COMMENT 4. Surgical [P], transverse, polyp - TUBULAR ADENOMA (1 OF 3 FRAGMENTS) - BENIGN COLONIC MUCOSA (2 OF 3 FRAGMENTS) - NO HIGH GRADE DYSPLASIA OR MALIGNANCY IDENTIFIED  Laboratory Studies  Reviewed in epic  Imaging Studies  No new relevant studies to review   ASSESSMENT  Mr. Popp is a 68 y.o. male with a pmh significant for  diabetes, hyperlipidemia, gallstones, history of H. pylori infection, cholelithiasis.  The patient is seen today for evaluation and management of:  1. History of Helicobacter infection   2. Intestinal metaplasia of gastric mucosa   3. Other chronic gastritis without hemorrhage   4. RUQ discomfort   5. Abnormal LFTs   6. Need for prophylactic vaccination and inoculation against viral hepatitis    The patient seems to be doing well overall and is hemodynamically and clinically stable.  I do think we have made a significant improvement in some of the discomfort that he was having after his treatment for H. pylori infection.  With that being said, not all of his symptoms are completely gone however a component of that which was postprandial discomfort seems to be improved.  I do think it is reasonable for now to hold on a cholecystectomy but that in mind due to his history of cholelithiasis and biliary sludge in the gallbladder.  He will continue to think about this but likely will hold on scheduling a cholecystectomy for now.  In regards to follow-up needs, he does have gastric intestinal metaplasia as well as chronic gastritis.  Due to his [place] descent and risk factor for  gastric cancer I do think that gastric mapping should be performed in this patient and we should have the patient on acid reducing medications in an effort of trying to understand his chronic gastritis.  Possible that chronic gastritis is just a reactive change after having had years of H. pylori infection but with the intestinal metaplasia component I do think we should do surveillance in this particular patient based on his background.  We will plan a follow-up endoscopy later this year at which point we will take gastric mapping biopsies to understand the overall consistency/significance of his intestinal metaplasia.  Surveillance will then be dictated based on those findings.  I would like the patient to maintain PPI therapy at least once daily and optimization of trying to help his chronic gastritis and he is willing to try that for now.  He has had a bilirubin elevation in the past we will plan to repeat his labs and see where he is currently.  He wants to ensure that he is immune to hepatitis A and hepatitis B as he has previously been and we will plan to restart his hepatitis B immunization series.  He has had imaging that suggested fatty liver and that could be a cause for some liver test abnormalities though his transferase's are not up and his bilirubin had been elevated previously in an indirect pattern so he may have underlying Gilbert's but an underlying fibrotic component seems less likely but should also be kept in differential.  All patient questions were answered, to the best of my ability, and the patient agrees to the aforementioned plan of action with follow-up as indicated.   PLAN  Plan to repeat EGD with gastric mapping biopsies before the end of this year Start omeprazole daily I think it is reasonable to hold on cholecystectomy for now but consider in the future as necessary based on symptoms Restart hepatitis B vaccine series Laboratories as outlined below   Orders Placed This  Encounter  Procedures  . Heplisav-B (HepB-CPG) Vaccine  . Hepatic function panel  . Hepatitis B Core Antibody, total    New Prescriptions   OMEPRAZOLE (PRILOSEC) 20 MG CAPSULE    Take 1 capsule (20 mg total) by mouth daily.   Modified Medications   No medications on  file    Planned Follow Up: No follow-ups on file.   Justice Britain, MD [facility] Advanced Endoscopy Office # [phone]

## 2018-12-12 ENCOUNTER — Encounter: Payer: Self-pay | Admitting: Gastroenterology

## 2018-12-12 DIAGNOSIS — Z23 Encounter for immunization: Secondary | ICD-10-CM | POA: Insufficient documentation

## 2018-12-12 DIAGNOSIS — R1011 Right upper quadrant pain: Secondary | ICD-10-CM | POA: Insufficient documentation

## 2018-12-12 DIAGNOSIS — K31A0 Gastric intestinal metaplasia, unspecified: Secondary | ICD-10-CM | POA: Insufficient documentation

## 2018-12-12 DIAGNOSIS — K2970 Gastritis, unspecified, without bleeding: Secondary | ICD-10-CM | POA: Insufficient documentation

## 2018-12-12 DIAGNOSIS — K3189 Other diseases of stomach and duodenum: Secondary | ICD-10-CM | POA: Insufficient documentation

## 2018-12-12 DIAGNOSIS — Z8619 Personal history of other infectious and parasitic diseases: Secondary | ICD-10-CM | POA: Insufficient documentation

## 2018-12-12 LAB — HEPATITIS B CORE ANTIBODY, TOTAL: Hep B Core Total Ab: NONREACTIVE

## 2018-12-13 ENCOUNTER — Ambulatory Visit: Payer: Medicare HMO | Admitting: Gastroenterology

## 2019-01-21 ENCOUNTER — Telehealth: Payer: Self-pay | Admitting: Gastroenterology

## 2019-01-21 NOTE — Telephone Encounter (Signed)
I reviewed the labs from May with the Patient's daughter .  All questions answered

## 2019-01-22 ENCOUNTER — Ambulatory Visit (INDEPENDENT_AMBULATORY_CARE_PROVIDER_SITE_OTHER): Payer: Medicare HMO | Admitting: Gastroenterology

## 2019-01-22 ENCOUNTER — Other Ambulatory Visit: Payer: Self-pay

## 2019-01-22 ENCOUNTER — Other Ambulatory Visit (INDEPENDENT_AMBULATORY_CARE_PROVIDER_SITE_OTHER): Payer: Medicare HMO

## 2019-01-22 DIAGNOSIS — R17 Unspecified jaundice: Secondary | ICD-10-CM

## 2019-01-22 DIAGNOSIS — Z23 Encounter for immunization: Secondary | ICD-10-CM | POA: Diagnosis not present

## 2019-01-22 LAB — HEPATIC FUNCTION PANEL
ALT: 12 U/L (ref 0–53)
AST: 21 U/L (ref 0–37)
Albumin: 4.7 g/dL (ref 3.5–5.2)
Alkaline Phosphatase: 68 U/L (ref 39–117)
Bilirubin, Direct: 0.3 mg/dL (ref 0.0–0.3)
Total Bilirubin: 1.9 mg/dL — ABNORMAL HIGH (ref 0.2–1.2)
Total Protein: 7.5 g/dL (ref 6.0–8.3)

## 2019-01-22 LAB — PROTIME-INR
INR: 1 ratio (ref 0.8–1.0)
Prothrombin Time: 11.6 s (ref 9.6–13.1)

## 2019-01-22 NOTE — Progress Notes (Signed)
hep

## 2019-03-30 DIAGNOSIS — R69 Illness, unspecified: Secondary | ICD-10-CM | POA: Diagnosis not present

## 2019-04-17 ENCOUNTER — Telehealth: Payer: Self-pay | Admitting: Family Medicine

## 2019-04-17 NOTE — Telephone Encounter (Signed)
Copied from [place] [id number]. Topic: General - Other >> Apr 17, 2019  3:07 PM Mathis Bud wrote: Reason for CRM: patient daughter called stating if patient needs a follow up appt. Patient daughter stated she got a call from office, but it was a machine asking so she wanted to check with PCP if he needed to come in  Call back [phone]   Please advise if patient needs to be scheduled for a follow up appointment, thank you.

## 2019-04-18 NOTE — Telephone Encounter (Signed)
Had interpreter call both phone numbers listed and LVM to schedule follow up appt.

## 2019-04-18 NOTE — Telephone Encounter (Signed)
Patient was last seen in January and was advised to follow up in 3-6 months.  He is overdue for a follow up.  Thanks.

## 2019-05-06 ENCOUNTER — Ambulatory Visit (INDEPENDENT_AMBULATORY_CARE_PROVIDER_SITE_OTHER): Payer: Medicare HMO | Admitting: Family Medicine

## 2019-05-06 ENCOUNTER — Encounter: Payer: Self-pay | Admitting: Family Medicine

## 2019-05-06 ENCOUNTER — Other Ambulatory Visit: Payer: Self-pay

## 2019-05-06 VITALS — BP 118/66 | HR 74 | Temp 98.3°F | Ht 68.0 in | Wt 155.0 lb

## 2019-05-06 DIAGNOSIS — E119 Type 2 diabetes mellitus without complications: Secondary | ICD-10-CM | POA: Diagnosis not present

## 2019-05-06 DIAGNOSIS — K2950 Unspecified chronic gastritis without bleeding: Secondary | ICD-10-CM

## 2019-05-06 DIAGNOSIS — Z0000 Encounter for general adult medical examination without abnormal findings: Secondary | ICD-10-CM | POA: Diagnosis not present

## 2019-05-06 DIAGNOSIS — E785 Hyperlipidemia, unspecified: Secondary | ICD-10-CM

## 2019-05-06 DIAGNOSIS — Z125 Encounter for screening for malignant neoplasm of prostate: Secondary | ICD-10-CM | POA: Diagnosis not present

## 2019-05-06 DIAGNOSIS — R430 Anosmia: Secondary | ICD-10-CM

## 2019-05-06 DIAGNOSIS — E1169 Type 2 diabetes mellitus with other specified complication: Secondary | ICD-10-CM | POA: Diagnosis not present

## 2019-05-06 LAB — CBC
HCT: 38 % — ABNORMAL LOW (ref 39.0–52.0)
Hemoglobin: 12.7 g/dL — ABNORMAL LOW (ref 13.0–17.0)
MCHC: 33.5 g/dL (ref 30.0–36.0)
MCV: 90.8 fl (ref 78.0–100.0)
Platelets: 203 10*3/uL (ref 150.0–400.0)
RBC: 4.19 Mil/uL — ABNORMAL LOW (ref 4.22–5.81)
RDW: 14.2 % (ref 11.5–15.5)
WBC: 4.3 10*3/uL (ref 4.0–10.5)

## 2019-05-06 LAB — COMPREHENSIVE METABOLIC PANEL
ALT: 16 U/L (ref 0–53)
AST: 24 U/L (ref 0–37)
Albumin: 4.6 g/dL (ref 3.5–5.2)
Alkaline Phosphatase: 70 U/L (ref 39–117)
BUN: 15 mg/dL (ref 6–23)
CO2: 30 mEq/L (ref 19–32)
Calcium: 9.4 mg/dL (ref 8.4–10.5)
Chloride: 102 mEq/L (ref 96–112)
Creatinine, Ser: 0.85 mg/dL (ref 0.40–1.50)
GFR: 90.2 mL/min (ref >60.00)
Glucose, Bld: 92 mg/dL (ref 70–99)
Potassium: 4 mEq/L (ref 3.5–5.1)
Sodium: 140 mEq/L (ref 135–145)
Total Bilirubin: 2.1 mg/dL — ABNORMAL HIGH (ref 0.2–1.2)
Total Protein: 7 g/dL (ref 6.0–8.3)

## 2019-05-06 LAB — PSA, MEDICARE: PSA: 0.52 ng/ml (ref 0.10–4.00)

## 2019-05-06 LAB — LIPID PANEL
Cholesterol: 150 mg/dL (ref 0–200)
HDL: 45.3 mg/dL (ref >39.00)
NonHDL: 105.02
Total CHOL/HDL Ratio: 3
Triglycerides: 221 mg/dL — ABNORMAL HIGH (ref 0.0–149.0)
VLDL: 44.2 mg/dL — ABNORMAL HIGH (ref 0.0–40.0)

## 2019-05-06 LAB — HEMOGLOBIN A1C: Hgb A1c MFr Bld: 6.9 % — ABNORMAL HIGH (ref 4.6–6.5)

## 2019-05-06 LAB — TSH: TSH: 3.92 u[IU]/mL (ref 0.35–4.50)

## 2019-05-06 LAB — LDL CHOLESTEROL, DIRECT: Direct LDL: 77 mg/dL

## 2019-05-06 NOTE — Progress Notes (Signed)
Chief Complaint:  Richard Reeves is a 68 y.o. male who presents today for a Medicare Initial Preventive Examination.  History was provided by patient via Richard Reeves.   Assessment/Plan:  Hyperlipidemia associated with type 2 diabetes mellitus (HCC) Check lipid panel.  Continue pravastatin 20 mg daily.  Type 2 diabetes mellitus without complication, without long-term current use of insulin (HCC) Check A1c.  Continue Metformin 1000 mg twice daily.  Gastritis Continue omeprazole 20 mg daily.  Advised him to follow-up with GI soon.  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  Will check CBC, C met, and TSH.  Reassured patient.  If continues to be problematic, would consider trial of Flonase and/or referral to ENT.  Preventative Healthcare Colonoscopy done earlier this year - due for repeat in 2027. Will check PSA today. Flu vaccine given already this season.   Advance Directives were discussed with the patient.   Patient Instructions (the written plan) was given to the patient.  Medicare Attestation I have personally reviewed: The patient's medical and social history Their use of alcohol, tobacco or illicit drugs Their current medications and supplements The patient's functional ability including ADLs,fall risks, home safety risks, cognitive, and hearing and visual impairment Diet and physical activities Evidence for depression or mood disorders  The patient's weight, height, BMI, and visual acuity have been recorded in the chart.  I have made referrals, counseling, and provided education to the patient based on review of the above and I have provided the patient with a written personalized care plan for preventive services.    During the course of the visit the patient was educated and counseled about appropriate screening and preventive services including:        Screening electrocardiogram Colorectal cancer screening Nutrition counseling  Advanced directives: Handout given.     Subjective:  HPI:  He has had some issues with anosmia for about 10 years. Seems to be worsening. Does not bother him. No treatments tried. No obvious aggravating factors.   Activities of Daily Living: He has no difficulty performing the following: . Preparing food and eating . Bathing  . Getting dressed . Using the toilet . Shopping . Managing Finances . Moving around from place to place  Fall Screening: He has not had any falls within the past year.   Hearing Screening: Patient has no concerns over hearing.  Safety Screening Patient feels safe at home.  There are no smokers at home.   Depression Screening: Depression screen [facility] 2/9 05/06/2019  Decreased Interest 0  Down, Depressed, Hopeless 0  PHQ - 2 Score 0   Lifestyle Factors: Diet: No specific diets or eating plans.  Exercise: Limited due to COVID.   Patient Care Team: Vivi Barrack, MD as PCP - General (Family Medicine)   His chronic medical conditions are outlined below:  # T2DM -Currently on metformin 1000 mg twice daily and tolerating well. - ROS: No polyruia or polydipsia  #GERD / Gastritis - Follows with GI -On Prilosec 40 mg daily tolerating well.  #HLD - On pravastatin 20 mg daily and tolerating well. - ROS: No reported myalgias  ROS: Per HPI  PMH:  The following were reviewed and entered/updated in epic: Past Medical History:  Diagnosis Date  . Diabetes mellitus without complication ([place])   . Hyperlipidemia    History reviewed. No pertinent surgical history. Family History  Problem Relation Age of Onset  . Cancer Mother   . Colon cancer Neg Hx   . Esophageal cancer Neg Hx   .  Inflammatory bowel disease Neg Hx   . Liver disease Neg Hx   . Pancreatic cancer Neg Hx   . Rectal cancer Neg Hx   . Stomach cancer Neg Hx     Medications- reviewed and updated Current Outpatient Medications  Medication Sig Dispense Refill  . aspirin EC 81 MG tablet Take 81 mg by mouth daily.    .  metFORMIN (GLUCOPHAGE) 1000 MG tablet Take 1 tablet (1,000 mg total) by mouth 2 (two) times daily with a meal. 180 tablet 3  . omeprazole (PRILOSEC) 20 MG capsule Take 1 capsule (20 mg total) by mouth daily. 30 capsule 6  . pravastatin (PRAVACHOL) 20 MG tablet Take 1 tablet (20 mg total) by mouth daily. 90 tablet 3   No current facility-administered medications for this visit.    Allergies-reviewed and updated No Known Allergies  Social History   Socioeconomic History  . Marital status: Married    Spouse name: Not on file  . Number of children: Not on file  . Years of education: Not on file  . Highest education level: Not on file  Occupational History  . Not on file  Social Needs  . Financial resource strain: Not on file  . Food insecurity    Worry: Not on file    Inability: Not on file  . Transportation needs    Medical: Not on file    Non-medical: Not on file  Tobacco Use  . Smoking status: Former Research scientist (life sciences)  . Smokeless tobacco: Never Used  Substance and Sexual Activity  . Alcohol use: No    Alcohol/week: 0.0 standard drinks    Frequency: Never  . Drug use: No  . Sexual activity: Yes    Partners: Female  Lifestyle  . Physical activity    Days per week: Not on file    Minutes per session: Not on file  . Stress: Not on file  Relationships  . Social Herbalist on phone: Not on file    Gets together: Not on file    Attends religious service: Not on file    Active member of club or organization: Not on file    Attends meetings of clubs or organizations: Not on file    Relationship status: Not on file  Other Topics Concern  . Not on file  Social History Narrative  . Not on file        Objective/Observations  Physical Exam: BP 118/66   Pulse 74   Temp 98.3 F (36.8 C)   Ht '5\' 8"'  (1.727 m)   Wt 155 lb (70.3 kg)   SpO2 97%   BMI 23.57 kg/m  Wt Readings from Last 3 Encounters:  05/06/19 155 lb (70.3 kg)  12/11/18 153 lb (69.4 kg)  10/18/18 158 lb  (71.7 kg)  Bilateral vision 20/20 Gen: NAD, resting comfortably CV: RRR with no murmurs appreciated Pulm: NWOB, CTAB with no crackles, wheezes, or rhonchi GI: Normal bowel sounds present. Soft, Nontender, Nondistended. MSK: no edema, cyanosis, or clubbing noted Skin: warm, dry Neuro: grossly normal, moves all extremities Psych: Normal affect and thought content  EKG: NSR. No ischemic changes.       [name]. [name], MD 05/06/2019 2:22 PM

## 2019-05-06 NOTE — Assessment & Plan Note (Signed)
Check lipid panel.  Continue pravastatin 20 mg daily. 

## 2019-05-06 NOTE — Assessment & Plan Note (Signed)
Continue omeprazole 20 mg daily.  Advised him to follow-up with GI soon.

## 2019-05-06 NOTE — Assessment & Plan Note (Signed)
Check A1c.  Continue Metformin 1000 mg twice daily. 

## 2019-05-06 NOTE — Patient Instructions (Addendum)
It was very nice to see you today!  We will check blood work today.  Please continue your current medications.   Please schedule an appointment with your GI doctor soon.   Come back in 6 months to recheck your blood sugar.  Take care, Dr [name]  Please try these tips to maintain a healthy lifestyle:   Eat at least 3 REAL meals and 1-2 snacks per day.  Aim for no more than 5 hours between eating.  If you eat breakfast, please do so within one hour of getting up.    Obtain twice as many fruits/vegetables as protein or carbohydrate foods for both lunch and dinner. (Half of each meal should be fruits/vegetables, one quarter protein, and one quarter starchy carbs)   Cut down on sweet beverages. This includes juice, soda, and sweet tea.    Exercise at least 150 minutes every week.    Preventive Care 66 Years and Older, Male Preventive care refers to lifestyle choices and visits with your health care provider that can promote health and wellness. This includes:  A yearly physical exam. This is also called an annual well check.  Regular dental and eye exams.  Immunizations.  Screening for certain conditions.  Healthy lifestyle choices, such as diet and exercise. What can I expect for my preventive care visit? Physical exam Your health care provider will check:  Height and weight. These may be used to calculate body mass index (BMI), which is a measurement that tells if you are at a healthy weight.  Heart rate and blood pressure.  Your skin for abnormal spots. Counseling Your health care provider may ask you questions about:  Alcohol, tobacco, and drug use.  Emotional well-being.  Home and relationship well-being.  Sexual activity.  Eating habits.  History of falls.  Memory and ability to understand (cognition).  Work and work Statistician. What immunizations do I need?  Influenza (flu) vaccine  This is recommended every year. Tetanus, diphtheria, and  pertussis (Tdap) vaccine  You may need a Td booster every 10 years. Varicella (chickenpox) vaccine  You may need this vaccine if you have not already been vaccinated. Zoster (shingles) vaccine  You may need this after age 110. Pneumococcal conjugate (PCV13) vaccine  One dose is recommended after age 82. Pneumococcal polysaccharide (PPSV23) vaccine  One dose is recommended after age 65. Measles, mumps, and rubella (MMR) vaccine  You may need at least one dose of MMR if you were born in 1957 or later. You may also need a second dose. Meningococcal conjugate (MenACWY) vaccine  You may need this if you have certain conditions. Hepatitis A vaccine  You may need this if you have certain conditions or if you travel or work in places where you may be exposed to hepatitis A. Hepatitis B vaccine  You may need this if you have certain conditions or if you travel or work in places where you may be exposed to hepatitis B. Haemophilus influenzae type b (Hib) vaccine  You may need this if you have certain conditions. You may receive vaccines as individual doses or as more than one vaccine together in one shot (combination vaccines). Talk with your health care provider about the risks and benefits of combination vaccines. What tests do I need? Blood tests  Lipid and cholesterol levels. These may be checked every 5 years, or more frequently depending on your overall health.  Hepatitis C test.  Hepatitis B test. Screening  Lung cancer screening. You may have this  screening every year starting at age 45 if you have a 30-pack-year history of smoking and currently smoke or have quit within the past 15 years.  Colorectal cancer screening. All adults should have this screening starting at age 44 and continuing until age 17. Your health care provider may recommend screening at age 57 if you are at increased risk. You will have tests every 1-10 years, depending on your results and the type of  screening test.  Prostate cancer screening. Recommendations will vary depending on your family history and other risks.  Diabetes screening. This is done by checking your blood sugar (glucose) after you have not eaten for a while (fasting). You may have this done every 1-3 years.  Abdominal aortic aneurysm (AAA) screening. You may need this if you are a current or former smoker.  Sexually transmitted disease (STD) testing. Follow these instructions at home: Eating and drinking  Eat a diet that includes fresh fruits and vegetables, whole grains, lean protein, and low-fat dairy products. Limit your intake of foods with high amounts of sugar, saturated fats, and salt.  Take vitamin and mineral supplements as recommended by your health care provider.  Do not drink alcohol if your health care provider tells you not to drink.  If you drink alcohol: ? Limit how much you have to 0-2 drinks a day. ? Be aware of how much alcohol is in your drink. In the [place] one drink equals one 12 oz bottle of beer (355 mL), one 5 oz glass of wine (148 mL), or one 1 oz glass of hard liquor (44 mL). Lifestyle  Take daily care of your teeth and gums.  Stay active. Exercise for at least 30 minutes on 5 or more days each week.  Do not use any products that contain nicotine or tobacco, such as cigarettes, e-cigarettes, and chewing tobacco. If you need help quitting, ask your health care provider.  If you are sexually active, practice safe sex. Use a condom or other form of protection to prevent STIs (sexually transmitted infections).  Talk with your health care provider about taking a low-dose aspirin or statin. What's next?  Visit your health care provider once a year for a well check visit.  Ask your health care provider how often you should have your eyes and teeth checked.  Stay up to date on all vaccines. This information is not intended to replace advice given to you by your health care provider.  Make sure you discuss any questions you have with your health care provider. Document Released: 07/31/2015 Document Revised: 06/28/2018 Document Reviewed: 06/28/2018 Elsevier Patient Education  2020 [place] Directive  Advance directives are legal documents that let you make choices ahead of time about your health care and medical treatment in case you become unable to communicate for yourself. Advance directives are a way for you to communicate your wishes to family, friends, and health care providers. This can help convey your decisions about end-of-life care if you become unable to communicate. Discussing and writing advance directives should happen over time rather than all at once. Advance directives can be changed depending on your situation and what you want, even after you have signed the advance directives. If you do not have an advance directive, some states assign family decision makers to act on your behalf based on how closely you are related to them. Each state has its own laws regarding advance directives. You may want to check with your health care provider,  attorney, or state representative about the laws in your state. There are different types of advance directives, such as:  Medical power of attorney.  Living will.  Do not resuscitate (DNR) or do not attempt resuscitation (DNAR) order. Health care proxy and medical power of attorney A health care proxy, also called a health care agent, is a person who is appointed to make medical decisions for you in cases in which you are unable to make the decisions yourself. Generally, people choose someone they know well and trust to represent their preferences. Make sure to ask this person for an agreement to act as your proxy. A proxy may have to exercise judgment in the event of a medical decision for which your wishes are not known. A medical power of attorney is a legal document that names your health care proxy.  Depending on the laws in your state, after the document is written, it may also need to be:  Signed.  Notarized.  Dated.  Copied.  Witnessed.  Incorporated into your medical record. You may also want to appoint someone to manage your financial affairs in a situation in which you are unable to do so. This is called a durable power of attorney for finances. It is a separate legal document from the durable power of attorney for health care. You may choose the same person or someone different from your health care proxy to act as your agent in financial matters. If you do not appoint a proxy, or if there is a concern that the proxy is not acting in your best interests, a court-appointed guardian may be designated to act on your behalf. Living will A living will is a set of instructions documenting your wishes about medical care when you cannot express them yourself. Health care providers should keep a copy of your living will in your medical record. You may want to give a copy to family members or friends. To alert caregivers in case of an emergency, you can place a card in your wallet to let them know that you have a living will and where they can find it. A living will is used if you become:  Terminally ill.  Incapacitated.  Unable to communicate or make decisions. Items to consider in your living will include:  The use or non-use of life-sustaining equipment, such as dialysis machines and breathing machines (ventilators).  A DNR or DNAR order, which is the instruction not to use cardiopulmonary resuscitation (CPR) if breathing or heartbeat stops.  The use or non-use of tube feeding.  Withholding of food and fluids.  Comfort (palliative) care when the goal becomes comfort rather than a cure.  Organ and tissue donation. A living will does not give instructions for distributing your money and property if you should pass away. It is recommended that you seek the advice of a lawyer when  writing a will. Decisions about taxes, beneficiaries, and asset distribution will be legally binding. This process can relieve your family and friends of any concerns surrounding disputes or questions that may come up about the distribution of your assets. DNR or DNAR A DNR or DNAR order is a request not to have CPR in the event that your heart stops beating or you stop breathing. If a DNR or DNAR order has not been made and shared, a health care provider will try to help any patient whose heart has stopped or who has stopped breathing. If you plan to have surgery, talk with your health care  provider about how your DNR or DNAR order will be followed if problems occur. Summary  Advance directives are the legal documents that allow you to make choices ahead of time about your health care and medical treatment in case you become unable to communicate for yourself.  The process of discussing and writing advance directives should happen over time. You can change the advance directives, even after you have signed them.  Advance directives include DNR or DNAR orders, living wills, and designating an agent as your medical power of attorney. This information is not intended to replace advice given to you by your health care provider. Make sure you discuss any questions you have with your health care provider. Document Released: 10/11/2007 Document Revised: 08/08/2018 Document Reviewed: 05/23/2016 Elsevier Patient Education  2020 [employer].

## 2019-05-07 NOTE — Progress Notes (Signed)
Please inform patient of the following:  All of his blood work is stable. Would like for him to continue his current medications and we can recheck again in a year.  Would like for him to come back in 6 months to recheck blood sugar.  [name]. [name], MD 05/07/2019 7:59 AM

## 2019-06-03 ENCOUNTER — Other Ambulatory Visit: Payer: Self-pay | Admitting: Family Medicine

## 2019-08-27 ENCOUNTER — Telehealth: Payer: Self-pay | Admitting: Gastroenterology

## 2019-08-27 NOTE — Telephone Encounter (Signed)
Per Dr Hilarie Fredrickson only colon is needed. See note from letter that was mailed to the pt 11/01/2018.    "There were no precancerous changes in any of the biopsies taken during your endoscopy. 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  There was no evidence of H. pylori infection."

## 2019-08-27 NOTE — Telephone Encounter (Signed)
Pt's interpreter stated stated after previous EGD/COL 10/2018, pt was recommended for a repeat EGD in six months, but only recall colon was placed.

## 2019-09-08 ENCOUNTER — Ambulatory Visit: Payer: Medicare HMO | Attending: Internal Medicine

## 2019-09-08 DIAGNOSIS — Z23 Encounter for immunization: Secondary | ICD-10-CM | POA: Insufficient documentation

## 2019-09-08 NOTE — Progress Notes (Signed)
   Covid-19 Vaccination Clinic  Name:  Richard Reeves    MRN: [id number] DOB: 1950/12/10  09/08/2019  [name] was observed post Covid-19 immunization for 15 minutes without incidence. He was provided with Vaccine Information Sheet and instruction to access the V-Safe system.   [name] was instructed to call 911 with any severe reactions post vaccine: Marland Kitchen Difficulty breathing  . Swelling of your face and throat  . A fast heartbeat  . A bad rash all over your body  . Dizziness and weakness    Immunizations Administered    Name Date Dose VIS Date Route   Pfizer COVID-19 Vaccine 09/08/2019  9:47 AM 0.3 mL 06/28/2019 Intramuscular   Manufacturer: [place]   Lot: [id number]   [place]: [id number]

## 2019-10-02 ENCOUNTER — Ambulatory Visit: Payer: Medicare HMO | Attending: Internal Medicine

## 2019-10-02 DIAGNOSIS — Z23 Encounter for immunization: Secondary | ICD-10-CM

## 2019-10-02 NOTE — Progress Notes (Signed)
   Covid-19 Vaccination Clinic  Name:  Odysseus Boulware    MRN: [id number] DOB: [birth year]  10/02/2019  Mr. Foronda was observed post Covid-19 immunization for 15 minutes without incident. He was provided with Vaccine Information Sheet and instruction to access the V-Safe system.   Mr. Enter was instructed to call 911 with any severe reactions post vaccine: Marland Kitchen Difficulty breathing  . Swelling of face and throat  . A fast heartbeat  . A bad rash all over body  . Dizziness and weakness   Immunizations Administered    Name Date Dose VIS Date Route   Pfizer COVID-19 Vaccine 10/02/2019  8:33 AM 0.3 mL 06/28/2019 Intramuscular   Manufacturer: [place]   Lot: [id number]   [place]: [id number]

## 2019-10-05 DIAGNOSIS — R69 Illness, unspecified: Secondary | ICD-10-CM | POA: Diagnosis not present

## 2019-11-06 ENCOUNTER — Encounter: Payer: Self-pay | Admitting: Family Medicine

## 2019-11-06 ENCOUNTER — Other Ambulatory Visit: Payer: Self-pay

## 2019-11-06 ENCOUNTER — Ambulatory Visit (INDEPENDENT_AMBULATORY_CARE_PROVIDER_SITE_OTHER): Payer: Medicare HMO | Admitting: Family Medicine

## 2019-11-06 VITALS — BP 138/80 | HR 73 | Temp 97.4°F | Ht 68.0 in | Wt 156.0 lb

## 2019-11-06 DIAGNOSIS — M79645 Pain in left finger(s): Secondary | ICD-10-CM

## 2019-11-06 DIAGNOSIS — E119 Type 2 diabetes mellitus without complications: Secondary | ICD-10-CM

## 2019-11-06 DIAGNOSIS — E785 Hyperlipidemia, unspecified: Secondary | ICD-10-CM

## 2019-11-06 DIAGNOSIS — M7702 Medial epicondylitis, left elbow: Secondary | ICD-10-CM

## 2019-11-06 DIAGNOSIS — E1169 Type 2 diabetes mellitus with other specified complication: Secondary | ICD-10-CM | POA: Diagnosis not present

## 2019-11-06 LAB — POCT GLYCOSYLATED HEMOGLOBIN (HGB A1C): Hemoglobin A1C: 6.3 % — AB (ref 4.0–5.6)

## 2019-11-06 MED ORDER — METFORMIN HCL 1000 MG PO TABS: ORAL_TABLET | ORAL | 3 refills | Status: DC

## 2019-11-06 MED ORDER — PRAVASTATIN SODIUM 20 MG PO TABS: 20.0000 mg | ORAL_TABLET | Freq: Every day | ORAL | 3 refills | Status: DC

## 2019-11-06 NOTE — Patient Instructions (Addendum)
It was very nice to see you today!  Keep up the good work!  Please use compression and ice her elbow.  Let me know if not improving in the next few weeks.  Take care, Dr [name]  Please try these tips to maintain a healthy lifestyle:   Eat at least 3 REAL meals and 1-2 snacks per day.  Aim for no more than 5 hours between eating.  If you eat breakfast, please do so within one hour of getting up.    Each meal should contain half fruits/vegetables, one quarter protein, and one quarter carbs (no bigger than a computer mouse)   Cut down on sweet beverages. This includes juice, soda, and sweet tea.     Drink at least 1 glass of water with each meal and aim for at least 8 glasses per day   Exercise at least 150 minutes every week.

## 2019-11-06 NOTE — Assessment & Plan Note (Addendum)
A1c stable at 6.3.  Congratulated patient on hard work.  Had extensive discussion regarding lifestyle modifications.  We will continue Metformin 1000 mg twice daily.  Follow-up in 6 months to recheck A1c.  Continue lifestyle modifications.

## 2019-11-06 NOTE — Progress Notes (Signed)
   Richard Reeves is a 69 y.o. male who presents today for an office visit.  History provided by patient via [place] interpretor.   Assessment/Plan:  New/Acute Problems: Medial epicondylitis No red flags.  Will start conservative management with compression/counterforce bracing, ice, and over-the-counter meds as needed.  Discussed home exercises and handout was given.  If no improvement in next 2 weeks would consider trial of anti-inflammatories.  Left finger Reeves Likely mild scar tissue on tendon related to remote injury.  No signs of trigger finger.  Able to fully move digit without complication.  Reassured patient.  Discussed reasons return to care.  Chronic Problems Addressed Today: Hyperlipidemia associated with type 2 diabetes mellitus (HCC) Continue pravastatin 20 mg daily.  Check lipid panel next blood draw.  Type 2 diabetes mellitus without complication, without long-term current use of insulin (HCC) A1c stable at 6.3.  Congratulated patient on hard work.  Had extensive discussion regarding lifestyle modifications.  We will continue Metformin 1000 mg twice daily.  Follow-up in 6 months to recheck A1c.  Continue lifestyle modifications.     Subjective:  HPI:  Patient here for follow-up.  He has been doing well.  He has been tolerating both his Metformin and pravastatin well without side effects.  He has been working on diet and exercise as well.  He has noticed left medial elbow Reeves for the past several months.  Worse with certain motions.  No Reeves at rest.  No obvious injuries or precipitating events.  No specific treatments tried.  He has also noticed mild Reeves in the third digit of his left hand.  He suffered an injury about 20 years ago that required stitches.       Objective:  Physical Exam: BP 138/80 (BP Location: Left Arm, Patient Position: Sitting, Cuff Size: Normal)   Pulse 73   Temp (!) 97.4 F (36.3 C) (Temporal)   Ht 5\' 8"  (1.727 m)   Wt 156 lb (70.8 kg)   SpO2  97%   BMI 23.72 kg/m   Gen: No acute distress, resting comfortably CV: Regular rate and rhythm with no murmurs appreciated Pulm: Normal work of breathing, clear to auscultation bilaterally with no crackles, wheezes, or rhonchi MSK: Left medial epicondyle tender to palpation.  Reeves with resisted pronation.  Left third digit with mild calcification along distal flexor tendon. Neuro: Grossly normal, moves all extremities Psych: Normal affect and thought content  Time Spent: 46 minutes of total time was spent on the date of the encounter performing the following actions: chart review prior to seeing the patient, obtaining history, performing a medically necessary exam, counseling on the treatment plan, placing orders, and documenting in our EHR.        Richard Reeves. Richard Pain, MD 11/06/2019 9:06 AM

## 2019-11-06 NOTE — Assessment & Plan Note (Signed)
Continue pravastatin 20 mg daily.  Check lipid panel next blood draw.

## 2019-12-02 ENCOUNTER — Telehealth: Payer: Self-pay | Admitting: Family Medicine

## 2019-12-02 NOTE — Telephone Encounter (Signed)
Ok with referral to ophthalmology but please triage patient - if it something that has happened acutely he needs to be seen ASAP.  Richard Reeves. [name], MD 12/02/2019 9:28 AM

## 2019-12-02 NOTE — Telephone Encounter (Signed)
Pt daughter called following up on referral. Daughter states her father needs to be seen by the ophthalmology ASAP. Daughter stated if pt receives call to be triaged, he will need a translator due to not speaking much [name]. Please advise.

## 2019-12-02 NOTE — Telephone Encounter (Signed)
Pt daughter called stating pt is starting to have vision issues. He is experiencing seeing white dots and having trouble seeing at night. Daughter asked if Dr. [name] could refer pt to an eye doctor. Please advise.

## 2019-12-02 NOTE — Telephone Encounter (Signed)
Please advise  Last OV 11/06/2019

## 2019-12-02 NOTE — Telephone Encounter (Signed)
Called pt with Richard Reeves [phone] Rt eye feels like something is inside x 1 year  Starting a month ago at night on rt eye pt sees light flashing and white water drops shadow looking like, in the morning pt sees small and big black spots.

## 2019-12-03 ENCOUNTER — Other Ambulatory Visit: Payer: Self-pay | Admitting: *Deleted

## 2019-12-03 DIAGNOSIS — H2513 Age-related nuclear cataract, bilateral: Secondary | ICD-10-CM | POA: Diagnosis not present

## 2019-12-03 DIAGNOSIS — H43399 Other vitreous opacities, unspecified eye: Secondary | ICD-10-CM

## 2019-12-03 DIAGNOSIS — H43811 Vitreous degeneration, right eye: Secondary | ICD-10-CM | POA: Diagnosis not present

## 2019-12-03 DIAGNOSIS — H40023 Open angle with borderline findings, high risk, bilateral: Secondary | ICD-10-CM | POA: Diagnosis not present

## 2019-12-03 DIAGNOSIS — E119 Type 2 diabetes mellitus without complications: Secondary | ICD-10-CM | POA: Diagnosis not present

## 2019-12-03 NOTE — Telephone Encounter (Signed)
Called pt with Richard Reeves [phone] Rt eye feels like something is inside x 1 year  Starting a month ago at night on rt eye pt sees light flashing and white water drops shadow looking like, in the morning pt sees small and big black spots

## 2019-12-03 NOTE — Telephone Encounter (Signed)
Referral placed.

## 2019-12-03 NOTE — Telephone Encounter (Signed)
Please place urgent referral to ophthalmology.  [name]. [name], MD 12/03/2019 8:58 AM

## 2020-02-03 DIAGNOSIS — E119 Type 2 diabetes mellitus without complications: Secondary | ICD-10-CM | POA: Diagnosis not present

## 2020-02-03 DIAGNOSIS — H43811 Vitreous degeneration, right eye: Secondary | ICD-10-CM | POA: Diagnosis not present

## 2020-02-03 DIAGNOSIS — H524 Presbyopia: Secondary | ICD-10-CM | POA: Diagnosis not present

## 2020-02-03 DIAGNOSIS — H04129 Dry eye syndrome of unspecified lacrimal gland: Secondary | ICD-10-CM | POA: Diagnosis not present

## 2020-02-03 DIAGNOSIS — H40003 Preglaucoma, unspecified, bilateral: Secondary | ICD-10-CM | POA: Diagnosis not present

## 2020-03-30 DIAGNOSIS — H2513 Age-related nuclear cataract, bilateral: Secondary | ICD-10-CM | POA: Diagnosis not present

## 2020-03-30 DIAGNOSIS — H40003 Preglaucoma, unspecified, bilateral: Secondary | ICD-10-CM | POA: Diagnosis not present

## 2020-03-30 DIAGNOSIS — H04123 Dry eye syndrome of bilateral lacrimal glands: Secondary | ICD-10-CM | POA: Diagnosis not present

## 2020-04-16 ENCOUNTER — Ambulatory Visit (INDEPENDENT_AMBULATORY_CARE_PROVIDER_SITE_OTHER): Payer: Medicare HMO | Admitting: Family Medicine

## 2020-04-16 ENCOUNTER — Other Ambulatory Visit: Payer: Self-pay

## 2020-04-16 ENCOUNTER — Encounter: Payer: Self-pay | Admitting: Family Medicine

## 2020-04-16 VITALS — BP 146/81 | HR 66 | Temp 97.9°F | Ht 68.0 in | Wt 156.8 lb

## 2020-04-16 DIAGNOSIS — K219 Gastro-esophageal reflux disease without esophagitis: Secondary | ICD-10-CM | POA: Diagnosis not present

## 2020-04-16 DIAGNOSIS — M545 Low back pain, unspecified: Secondary | ICD-10-CM

## 2020-04-16 DIAGNOSIS — E119 Type 2 diabetes mellitus without complications: Secondary | ICD-10-CM

## 2020-04-16 DIAGNOSIS — Z23 Encounter for immunization: Secondary | ICD-10-CM

## 2020-04-16 LAB — POCT GLYCOSYLATED HEMOGLOBIN (HGB A1C): Hemoglobin A1C: 6.5 % — AB (ref 4.0–5.6)

## 2020-04-16 MED ORDER — DICLOFENAC SODIUM 75 MG PO TBEC: 75.0000 mg | DELAYED_RELEASE_TABLET | Freq: Two times a day (BID) | ORAL | 0 refills | Status: DC

## 2020-04-16 NOTE — Assessment & Plan Note (Signed)
Stable.  Continue Prilosec 20 mg daily. 

## 2020-04-16 NOTE — Assessment & Plan Note (Signed)
A1c stable at 6.5. Will continue metformin 1000mg  twice daily. Follow up in 6 months.

## 2020-04-16 NOTE — Patient Instructions (Addendum)
It was very nice to see you today!  I think you have a pulled muscle. Please take the diclofenac and use a heating pad. Please working on the exercises.  I will place an order for you to get an xray at the [place] office.   Your A1c looks great today.   I will see you back in 6 months for you annual check up with blood work.  Come back to see me sooner if needed.   Take care, Dr [name]  Please try these tips to maintain a healthy lifestyle:   Eat at least 3 REAL meals and 1-2 snacks per day.  Aim for no more than 5 hours between eating.  If you eat breakfast, please do so within one hour of getting up.    Each meal should contain half fruits/vegetables, one quarter protein, and one quarter carbs (no bigger than a computer mouse)   Cut down on sweet beverages. This includes juice, soda, and sweet tea.     Drink at least 1 glass of water with each meal and aim for at least 8 glasses per day   Exercise at least 150 minutes every week.

## 2020-04-16 NOTE — Progress Notes (Signed)
° °  Richard Reeves is a 70 y.o. male who presents today for an office visit.  Assessment/Plan:  New/Acute Problems: Back Pain No red flags.  Likely muscular strain.  Given 24-month history will check plain film.  Discussed home exercises and handout was given.  Also start diclofenac.  Chronic Problems Addressed Today: Type 2 diabetes mellitus without complication, without long-term current use of insulin (HCC) A1c stable at 6.5. Will continue metformin 1000mg  twice daily. Follow up in 6 months.   Gastroesophageal reflux disease Stable.  Continue Prilosec 20 mg daily.     Subjective:  HPI:  Patient here for follow-up.  Has had some issues with low back pain for last couple of months.  No obvious injuries or precipitating events.  No reported bowel or bladder incontinence.  No fevers or chills.  Has been doing more heavy lifting at work.  No specific treatments tried.       Objective:  Physical Exam: BP (!) 146/81    Pulse 66    Temp 97.9 F (36.6 C) (Temporal)    Ht 5\' 8"  (1.727 m)    Wt 156 lb 12.8 oz (71.1 kg)    SpO2 97%    BMI 23.84 kg/m   Gen: No acute distress, resting comfortably CV: Regular rate and rhythm with no murmurs appreciated Pulm: Normal work of breathing, clear to auscultation bilaterally with no crackles, wheezes, or rhonchi MSK: Tenderness along lower lumbar paraspinal groups. Neuro: Grossly normal, moves all extremities Psych: Normal affect and thought content      Richard Reeves M. [name], MD 04/16/2020 1:33 PM

## 2020-04-17 ENCOUNTER — Ambulatory Visit (INDEPENDENT_AMBULATORY_CARE_PROVIDER_SITE_OTHER)
Admission: RE | Admit: 2020-04-17 | Discharge: 2020-04-17 | Disposition: A | Discharge status: Home / Self Care | Payer: Medicare HMO | Source: Ambulatory Visit | Attending: Family Medicine | Admitting: Family Medicine

## 2020-04-17 DIAGNOSIS — M545 Low back pain, unspecified: Secondary | ICD-10-CM

## 2020-04-20 NOTE — Progress Notes (Signed)
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 Would like for him to let us know if his pain is not improving.  Richard Reeves. [name], MD 04/20/2020 8:17 AM

## 2020-04-21 ENCOUNTER — Other Ambulatory Visit: Payer: Self-pay | Admitting: *Deleted

## 2020-04-21 ENCOUNTER — Telehealth: Payer: Self-pay | Admitting: *Deleted

## 2020-04-21 DIAGNOSIS — M069 Rheumatoid arthritis, unspecified: Secondary | ICD-10-CM

## 2020-04-21 NOTE — Telephone Encounter (Signed)
Patient daughter called stating her dad still with pain when standing and getting up from sit or bed  No pain with walking or standing. Requesting PT or medication to help Sx

## 2020-04-21 NOTE — Telephone Encounter (Signed)
Referral placed.

## 2020-04-21 NOTE — Telephone Encounter (Signed)
Ok to place referral to sports medicine.  Richard Reeves. [name], MD 04/21/2020 12:18 PM

## 2020-04-28 ENCOUNTER — Ambulatory Visit (INDEPENDENT_AMBULATORY_CARE_PROVIDER_SITE_OTHER): Payer: Medicare HMO | Admitting: Family Medicine

## 2020-04-28 ENCOUNTER — Encounter: Payer: Self-pay | Admitting: Family Medicine

## 2020-04-28 ENCOUNTER — Other Ambulatory Visit: Payer: Self-pay

## 2020-04-28 VITALS — BP 130/80 | HR 72 | Ht 68.0 in | Wt 158.0 lb

## 2020-04-28 DIAGNOSIS — M545 Low back pain, unspecified: Secondary | ICD-10-CM | POA: Diagnosis not present

## 2020-04-28 NOTE — Progress Notes (Signed)
Subjective:   I, Richard Reeves, am serving as a scribe for Dr. [name].  I'm seeing this patient as a consultation for:  Richard Barrack, MD. Note will be routed back to referring provider/PCP.  CC: Low back pain  HPI: patient is a 69 year old Male presenting to [place] at [facility] for Low back pain X2 months with no MOI but has been doing more heavy lifting at work. Patient has tried diclofenac tablets, exercises given by PCP, and heat. Patient had an xray on 04/17/2020 that showed Mild arthritis in low back. Heat makes it worse stopped doing that, patient locates issue to low back more of a discomfort than a pain and it is worse in the morning. Patient states "feels like something is there." doesn't hurt during the day while doing things but when waking up in the morning that low back has a dull ache. If lifting and sitting to standing will feel a burning sensation. Does not have to take anything for the pain at all.   States that in [place] 3 years ago did have a bulging disk in the neck and received a injection in the neck that helped with his neck.   States that area has been not feeling so well since the injection wore off not as painful as before.  Left elbow hurts when extending his arm straight.   Past medical history, Surgical history, Family history, Social history, Allergies, and medications have been entered into the medical record, reviewed.   Review of Systems: No new headache, visual changes, nausea, vomiting, diarrhea, constipation, dizziness, abdominal pain, skin rash, fevers, chills, night sweats, weight loss, swollen lymph nodes, body aches, joint swelling, muscle aches, chest pain, shortness of breath, mood changes, visual or auditory hallucinations.   Objective:    Vitals:   04/28/20 1407  BP: 130/80  Pulse: 72  SpO2: 98%   General: Well Developed, well nourished, and in no acute distress.  Neuro/Psych: Alert and oriented x3, extra-ocular  muscles intact, able to move all 4 extremities, sensation grossly intact. Skin: Warm and dry, no rashes noted.  Respiratory: Not using accessory muscles, speaking in full sentences, trachea midline.  Cardiovascular: Pulses palpable, no extremity edema. Abdomen: Does not appear distended. MSK: L-spine normal-appearing nontender midline.  Minimally tender paraspinal musculature lumbar spine. Normal motion.  Lower extremity strength reflexes and sensation are equal throughout.  Lab and Radiology Results X-ray images L-spine obtained October 2 personally and independently interpreted. Minimal lumbar DDD.  No acute fractures or malalignment. Await formal radiology review  Impression and Recommendations:    Assessment and Plan: 69 y.o. male with low back pain chronic ongoing for months.  X-ray largely normal.  Discussed options.  Plan for trial of physical therapy.  Recheck back if not improving in 6 weeks or so..  Will [facility] as his wife went there previously.  PDMP not reviewed this encounter. Orders Placed This Encounter  Procedures  . Ambulatory referral to Physical Therapy    Referral Priority:   Routine    Referral Type:   Physical Medicine    Referral Reason:   Specialty Services Required    Requested Specialty:   Physical Therapy    Number of Visits Requested:   1   No orders of the defined types were placed in this encounter.   Discussed warning signs or symptoms. Please see discharge instructions. Patient expresses understanding.   The above documentation has been reviewed and  is accurate and complete [name], M.D.  Visit conducted using a [place] interpreter telephone service.  Total encounter time 30 minutes including face-to-face time with the patient and, reviewing past medical record, and charting on the date of service.   Treatment plan and options and diagnosis.

## 2020-04-28 NOTE — Patient Instructions (Addendum)
Thank you for coming in today.  I've referred you to Physical Therapy.  Let us know if you don't hear from them in one week.  Recheck in 6 weeks if not better.

## 2020-05-05 ENCOUNTER — Ambulatory Visit: Payer: Medicare HMO | Admitting: Family Medicine

## 2020-05-15 DIAGNOSIS — M545 Low back pain, unspecified: Secondary | ICD-10-CM | POA: Diagnosis not present

## 2020-05-23 DIAGNOSIS — R69 Illness, unspecified: Secondary | ICD-10-CM | POA: Diagnosis not present

## 2020-06-09 ENCOUNTER — Ambulatory Visit: Payer: Medicare HMO | Admitting: Family Medicine

## 2020-07-03 ENCOUNTER — Telehealth: Payer: Self-pay | Admitting: Family Medicine

## 2020-07-03 NOTE — Progress Notes (Signed)
  Chronic Care Management   Outreach Note  07/03/2020 Name: Richard Reeves MRN: [id number] DOB: 1951/06/06  Referred by: Vivi Barrack, MD Reason for referral : No chief complaint on file.   An unsuccessful telephone outreach was attempted today. The patient was referred to the pharmacist for assistance with care management and care coordination.   Follow Up Plan:   Lauretta Grill Upstream Scheduler

## 2020-07-29 ENCOUNTER — Telehealth: Payer: Self-pay | Admitting: Family Medicine

## 2020-07-29 NOTE — Progress Notes (Signed)
  Chronic Care Management   Outreach Note  07/29/2020 Name: Richard Reeves MRN: [id number] DOB: 02-04-1951  Referred by: Vivi Barrack, MD Reason for referral : No chief complaint on file.   An unsuccessful telephone outreach was attempted today. The patient was referred to the pharmacist for assistance with care management and care coordination.   Follow Up Plan:   Lauretta Grill Upstream Scheduler

## 2020-09-03 DIAGNOSIS — H04123 Dry eye syndrome of bilateral lacrimal glands: Secondary | ICD-10-CM | POA: Diagnosis not present

## 2020-09-03 DIAGNOSIS — H2513 Age-related nuclear cataract, bilateral: Secondary | ICD-10-CM | POA: Diagnosis not present

## 2020-09-03 DIAGNOSIS — E113293 Type 2 diabetes mellitus with mild nonproliferative diabetic retinopathy without macular edema, bilateral: Secondary | ICD-10-CM | POA: Diagnosis not present

## 2020-09-03 DIAGNOSIS — H01009 Unspecified blepharitis unspecified eye, unspecified eyelid: Secondary | ICD-10-CM | POA: Diagnosis not present

## 2020-09-03 DIAGNOSIS — Z7984 Long term (current) use of oral hypoglycemic drugs: Secondary | ICD-10-CM | POA: Diagnosis not present

## 2020-09-03 DIAGNOSIS — H40003 Preglaucoma, unspecified, bilateral: Secondary | ICD-10-CM | POA: Diagnosis not present

## 2020-10-02 ENCOUNTER — Telehealth: Payer: Self-pay | Admitting: Family Medicine

## 2020-10-02 NOTE — Chronic Care Management (AMB) (Signed)
  Chronic Care Management   Outreach Note  10/02/2020 Name: Azeez Dunker MRN: [id number] DOB: [birth year]  Referred by: Vivi Barrack, MD Reason for referral : No chief complaint on file.   A second unsuccessful telephone outreach was attempted today. The patient was referred to pharmacist for assistance with care management and care coordination.  Follow Up Plan:   Lauretta Grill Upstream Scheduler

## 2020-10-09 ENCOUNTER — Telehealth: Payer: Self-pay | Admitting: Family Medicine

## 2020-10-09 NOTE — Progress Notes (Signed)
  Chronic Care Management   Outreach Note  10/09/2020 Name: Richard Reeves MRN: [id number] DOB: 01-08-1951  Referred by: Vivi Barrack, MD Reason for referral : No chief complaint on file.   A second unsuccessful telephone outreach was attempted today. The patient was referred to pharmacist for assistance with care management and care coordination.  Follow Up Plan:   Lauretta Grill Upstream Scheduler

## 2020-10-13 ENCOUNTER — Encounter: Payer: Self-pay | Admitting: Family Medicine

## 2020-10-13 ENCOUNTER — Other Ambulatory Visit: Payer: Self-pay

## 2020-10-13 ENCOUNTER — Ambulatory Visit (INDEPENDENT_AMBULATORY_CARE_PROVIDER_SITE_OTHER): Payer: Medicare HMO | Admitting: Family Medicine

## 2020-10-13 VITALS — BP 139/78 | HR 61 | Temp 97.1°F | Ht 68.0 in | Wt 160.8 lb

## 2020-10-13 DIAGNOSIS — E785 Hyperlipidemia, unspecified: Secondary | ICD-10-CM

## 2020-10-13 DIAGNOSIS — Z125 Encounter for screening for malignant neoplasm of prostate: Secondary | ICD-10-CM | POA: Diagnosis not present

## 2020-10-13 DIAGNOSIS — E1169 Type 2 diabetes mellitus with other specified complication: Secondary | ICD-10-CM

## 2020-10-13 DIAGNOSIS — Z0001 Encounter for general adult medical examination with abnormal findings: Secondary | ICD-10-CM

## 2020-10-13 DIAGNOSIS — K31A0 Gastric intestinal metaplasia, unspecified: Secondary | ICD-10-CM

## 2020-10-13 DIAGNOSIS — E119 Type 2 diabetes mellitus without complications: Secondary | ICD-10-CM | POA: Diagnosis not present

## 2020-10-13 DIAGNOSIS — Z23 Encounter for immunization: Secondary | ICD-10-CM

## 2020-10-13 LAB — LIPID PANEL
Cholesterol: 165 mg/dL (ref 0–200)
HDL: 49.4 mg/dL (ref >39.00)
LDL Cholesterol: 86 mg/dL (ref 0–99)
NonHDL: 115.45
Total CHOL/HDL Ratio: 3
Triglycerides: 147 mg/dL (ref 0.0–149.0)
VLDL: 29.4 mg/dL (ref 0.0–40.0)

## 2020-10-13 LAB — MICROALBUMIN / CREATININE URINE RATIO
Creatinine,U: 129.7 mg/dL
Microalb Creat Ratio: 3.6 mg/g (ref 0.0–30.0)
Microalb, Ur: 4.7 mg/dL — ABNORMAL HIGH (ref 0.0–1.9)

## 2020-10-13 LAB — COMPREHENSIVE METABOLIC PANEL
ALT: 17 U/L (ref 0–53)
AST: 24 U/L (ref 0–37)
Albumin: 4.7 g/dL (ref 3.5–5.2)
Alkaline Phosphatase: 64 U/L (ref 39–117)
BUN: 16 mg/dL (ref 6–23)
CO2: 29 mEq/L (ref 19–32)
Calcium: 9.5 mg/dL (ref 8.4–10.5)
Chloride: 104 mEq/L (ref 96–112)
Creatinine, Ser: 0.84 mg/dL (ref 0.40–1.50)
GFR: 90.33 mL/min (ref >60.00)
Glucose, Bld: 112 mg/dL — ABNORMAL HIGH (ref 70–99)
Potassium: 4.3 mEq/L (ref 3.5–5.1)
Sodium: 140 mEq/L (ref 135–145)
Total Bilirubin: 2 mg/dL — ABNORMAL HIGH (ref 0.2–1.2)
Total Protein: 7.1 g/dL (ref 6.0–8.3)

## 2020-10-13 LAB — POCT GLYCOSYLATED HEMOGLOBIN (HGB A1C): Hemoglobin A1C: 6.6 % — AB (ref 4.0–5.6)

## 2020-10-13 LAB — CBC
HCT: 39.7 % (ref 39.0–52.0)
Hemoglobin: 13.4 g/dL (ref 13.0–17.0)
MCHC: 33.9 g/dL (ref 30.0–36.0)
MCV: 91.2 fl (ref 78.0–100.0)
Platelets: 191 10*3/uL (ref 150.0–400.0)
RBC: 4.35 Mil/uL (ref 4.22–5.81)
RDW: 13.6 % (ref 11.5–15.5)
WBC: 4.2 10*3/uL (ref 4.0–10.5)

## 2020-10-13 LAB — TSH: TSH: 5.15 u[IU]/mL — ABNORMAL HIGH (ref 0.35–4.50)

## 2020-10-13 LAB — PSA: PSA: 0.66 ng/mL (ref 0.10–4.00)

## 2020-10-13 MED ORDER — METFORMIN HCL 1000 MG PO TABS
ORAL_TABLET | ORAL | 3 refills | Status: DC
Start: 2020-10-13 — End: 2021-12-31

## 2020-10-13 MED ORDER — PRAVASTATIN SODIUM 20 MG PO TABS
20.0000 mg | ORAL_TABLET | Freq: Every day | ORAL | 3 refills | Status: DC
Start: 2020-10-13 — End: 2021-11-17

## 2020-10-13 NOTE — Assessment & Plan Note (Signed)
A1c at goal at 6.6.  Continue Metformin 1000 g twice daily.

## 2020-10-13 NOTE — Assessment & Plan Note (Signed)
Has had some bloating lately but symptoms seem to be improving.  Advised him to follow-up with GI soon if symptoms persist.

## 2020-10-13 NOTE — Progress Notes (Signed)
Chief Complaint:  Richard Reeves is a 70 y.o. male who presents today for his annual comprehensive physical exam.    Assessment/Plan:  Chronic Problems Addressed Today: Intestinal metaplasia of gastric mucosa Has had some bloating lately but symptoms seem to be improving.  Advised him to follow-up with GI soon if symptoms persist.  Hyperlipidemia associated with type 2 diabetes mellitus ([name]) Check lipids today.  Continue pravastatin 40 mg daily.  Type 2 diabetes mellitus without complication, without long-term current use of insulin (HCC) A1c at goal at 6.6.  Continue Metformin 1000 g twice daily.  Preventative Healthcare: Pneumovax given today.  Check labs.  Check urine microalbumin.  Patient Counseling(The following topics were reviewed and/or handout was given):  -Nutrition: Stressed importance of moderation in sodium/caffeine intake, saturated fat and cholesterol, caloric balance, sufficient intake of fresh fruits, vegetables, and fiber.  -Stressed the importance of regular exercise.   -Substance Abuse: Discussed cessation/primary prevention of tobacco, alcohol, or other drug use; driving or other dangerous activities under the influence; availability of treatment for abuse.   -Injury prevention: Discussed safety belts, safety helmets, smoke detector, smoking near bedding or upholstery.   -Sexuality: Discussed sexually transmitted diseases, partner selection, use of condoms, avoidance of unintended pregnancy and contraceptive alternatives.   -Dental health: Discussed importance of regular tooth brushing, flossing, and dental visits.  -Health maintenance and immunizations reviewed. Please refer to Health maintenance section.  Return to care in 1 year for next preventative visit.     Subjective:  HPI:  He has no acute complaints today.   Lifestyle Diet: Balanced.  Exercise: Regular.   Depression screen PHQ 2/9 10/13/2020  Decreased Interest 0  Down, Depressed, Hopeless 0  PHQ -  2 Score 0    Health Maintenance Due  Topic Date Due  . OPHTHALMOLOGY EXAM  Never done  . URINE MICROALBUMIN  07/25/2019  . FOOT EXAM  05/05/2020     ROS: Per HPI, otherwise a complete review of systems was negative.   PMH:  The following were reviewed and entered/updated in epic: Past Medical History:  Diagnosis Date  . Diabetes mellitus without complication ([place])   . Hyperlipidemia    Patient Active Problem List   Diagnosis Date Noted  . History of Helicobacter infection 12/12/2018  . Intestinal metaplasia of gastric mucosa 12/12/2018  . Gastritis 12/12/2018  . History of colonic polyps 09/04/2018  . Gastroesophageal reflux disease 09/04/2018  . Fatty liver 09/04/2018  . Abnormal LFTs 09/04/2018  . Gallstones 09/04/2018  . Biliary colic 38/18/2993  . Type 2 diabetes mellitus without complication, without long-term current use of insulin ([place]) 07/10/2017  . Hyperlipidemia associated with type 2 diabetes mellitus ([place]) 07/10/2017  . Hyperbilirubinemia 07/10/2017  . Swelling of lower extremity 07/10/2017   History reviewed. No pertinent surgical history.  Family History  Problem Relation Age of Onset  . Cancer Mother   . Colon cancer Neg Hx   . Esophageal cancer Neg Hx   . Inflammatory bowel disease Neg Hx   . Liver disease Neg Hx   . Pancreatic cancer Neg Hx   . Rectal cancer Neg Hx   . Stomach cancer Neg Hx     Medications- reviewed and updated Current Outpatient Medications  Medication Sig Dispense Refill  . aspirin EC 81 MG tablet Take 81 mg by mouth daily.    . diclofenac (VOLTAREN) 75 MG EC tablet Take 1 tablet (75 mg total) by mouth 2 (two) times daily. 30 tablet 0  . metFORMIN (  GLUCOPHAGE) 1000 MG tablet TAKE 1 TABLET BY MOUTH TWICE DAILY WITH A MEAL 180 tablet 3  . pravastatin (PRAVACHOL) 20 MG tablet Take 1 tablet (20 mg total) by mouth daily. 90 tablet 3   No current facility-administered medications for this visit.    Allergies-reviewed and  updated No Known Allergies  Social History   Socioeconomic History  . Marital status: Married    Spouse name: Not on file  . Number of children: Not on file  . Years of education: Not on file  . Highest education level: Not on file  Occupational History  . Not on file  Tobacco Use  . Smoking status: Former Research scientist (life sciences)  . Smokeless tobacco: Never Used  Vaping Use  . Vaping Use: Never used  Substance and Sexual Activity  . Alcohol use: No    Alcohol/week: 0.0 standard drinks  . Drug use: No  . Sexual activity: Yes    Partners: Female  Other Topics Concern  . Not on file  Social History Narrative  . Not on file   Social Determinants of Health   Financial Resource Strain: Not on file  Food Insecurity: Not on file  Transportation Needs: Not on file  Physical Activity: Not on file  Stress: Not on file  Social Connections: Not on file        Objective:  Physical Exam: BP 139/78   Pulse 61   Temp (!) 97.1 F (36.2 C) (Temporal)   Ht 5\' 8"  (1.727 m)   Wt 160 lb 12.8 oz (72.9 kg)   SpO2 98%   BMI 24.45 kg/m   Body mass index is 24.45 kg/m. Wt Readings from Last 3 Encounters:  10/13/20 160 lb 12.8 oz (72.9 kg)  04/28/20 158 lb (71.7 kg)  04/16/20 156 lb 12.8 oz (71.1 kg)   Gen: NAD, resting comfortably HEENT: TMs normal bilaterally. OP clear. No thyromegaly noted.  CV: RRR with no murmurs appreciated Pulm: NWOB, CTAB with no crackles, wheezes, or rhonchi GI: Normal bowel sounds present. Soft, Nontender, Nondistended. MSK: no edema, cyanosis, or clubbing noted Skin: warm, dry Neuro: CN2-12 grossly intact. Strength 5/5 in upper and lower extremities. Reflexes symmetric and intact bilaterally.  Psych: Normal affect and thought content     Drue Camera M. [name], MD 10/13/2020 10:28 AM

## 2020-10-13 NOTE — Patient Instructions (Signed)
It was very nice to see you today!  We will check blood work today.   Your blood sugar looks great.  Do not need to make any changes with your medications at this point.  If your symptoms persist please call your GI doctor Phone: [phone].  I will see you back in 6 months.  Take care, Dr [name]  PLEASE NOTE:  If you had any lab tests please let us know if you have not heard back within a few days. You may see your results on mychart before we have a chance to review them but we will give you a call once they are reviewed by [place]. If we ordered any referrals today, please let us know if you have not heard from their office within the next week.   Please try these tips to maintain a healthy lifestyle:   Eat at least 3 REAL meals and 1-2 snacks per day.  Aim for no more than 5 hours between eating.  If you eat breakfast, please do so within one hour of getting up.    Each meal should contain half fruits/vegetables, one quarter protein, and one quarter carbs (no bigger than a computer mouse)   Cut down on sweet beverages. This includes juice, soda, and sweet tea.     Drink at least 1 glass of water with each meal and aim for at least 8 glasses per day   Exercise at least 150 minutes every week.    Preventive Care 92 Years and Older, Male Preventive care refers to lifestyle choices and visits with your health care provider that can promote health and wellness. This includes:  A yearly physical exam. This is also called an annual wellness visit.  Regular dental and eye exams.  Immunizations.  Screening for certain conditions.  Healthy lifestyle choices, such as: ? Eating a healthy diet. ? Getting regular exercise. ? Not using drugs or products that contain nicotine and tobacco. ? Limiting alcohol use. What can I expect for my preventive care visit? Physical exam Your health care provider will check your:  Height and weight. These may be used to calculate your BMI  (body mass index). BMI is a measurement that tells if you are at a healthy weight.  Heart rate and blood pressure.  Body temperature.  Skin for abnormal spots. Counseling Your health care provider may ask you questions about your:  Past medical problems.  Family's medical history.  Alcohol, tobacco, and drug use.  Emotional well-being.  Home life and relationship well-being.  Sexual activity.  Diet, exercise, and sleep habits.  History of falls.  Memory and ability to understand (cognition).  Work and work Statistician.  Access to firearms. What immunizations do I need? Vaccines are usually given at various ages, according to a schedule. Your health care provider will recommend vaccines for you based on your age, medical history, and lifestyle or other factors, such as travel or where you work.   What tests do I need? Blood tests  Lipid and cholesterol levels. These may be checked every 5 years, or more often depending on your overall health.  Hepatitis C test.  Hepatitis B test. Screening  Lung cancer screening. You may have this screening every year starting at age 74 if you have a 30-pack-year history of smoking and currently smoke or have quit within the past 15 years.  Colorectal cancer screening. ? All adults should have this screening starting at age 15 and continuing until age 14. ?  Your health care provider may recommend screening at age 44 if you are at increased risk. ? You will have tests every 1-10 years, depending on your results and the type of screening test.  Prostate cancer screening. Recommendations will vary depending on your family history and other risks.  Genital exam to check for testicular cancer or hernias.  Diabetes screening. ? This is done by checking your blood sugar (glucose) after you have not eaten for a while (fasting). ? You may have this done every 1-3 years.  Abdominal aortic aneurysm (AAA) screening. You may need this if  you are a current or former smoker.  STD (sexually transmitted disease) testing, if you are at risk. Follow these instructions at home: Eating and drinking  Eat a diet that includes fresh fruits and vegetables, whole grains, lean protein, and low-fat dairy products. Limit your intake of foods with high amounts of sugar, saturated fats, and salt.  Take vitamin and mineral supplements as recommended by your health care provider.  Do not drink alcohol if your health care provider tells you not to drink.  If you drink alcohol: ? Limit how much you have to 0-2 drinks a day. ? Be aware of how much alcohol is in your drink. In the [place] one drink equals one 12 oz bottle of beer (355 mL), one 5 oz glass of wine (148 mL), or one 1 oz glass of hard liquor (44 mL).   Lifestyle  Take daily care of your teeth and gums. Brush your teeth every morning and night with fluoride toothpaste. Floss one time each day.  Stay active. Exercise for at least 30 minutes 5 or more days each week.  Do not use any products that contain nicotine or tobacco, such as cigarettes, e-cigarettes, and chewing tobacco. If you need help quitting, ask your health care provider.  Do not use drugs.  If you are sexually active, practice safe sex. Use a condom or other form of protection to prevent STIs (sexually transmitted infections).  Talk with your health care provider about taking a low-dose aspirin or statin.  Find healthy ways to cope with stress, such as: ? Meditation, yoga, or listening to music. ? Journaling. ? Talking to a trusted person. ? Spending time with friends and family. Safety  Always wear your seat belt while driving or riding in a vehicle.  Do not drive: ? If you have been drinking alcohol. Do not ride with someone who has been drinking. ? When you are tired or distracted. ? While texting.  Wear a helmet and other protective equipment during sports activities.  If you have firearms in your  house, make sure you follow all gun safety procedures. What's next?  Visit your health care provider once a year for an annual wellness visit.  Ask your health care provider how often you should have your eyes and teeth checked.  Stay up to date on all vaccines. This information is not intended to replace advice given to you by your health care provider. Make sure you discuss any questions you have with your health care provider. Document Revised: 04/02/2019 Document Reviewed: 06/28/2018 Elsevier Patient Education  2021 [employer].

## 2020-10-13 NOTE — Assessment & Plan Note (Signed)
Check lipids today.  Continue pravastatin 40 mg daily.

## 2020-10-14 NOTE — Progress Notes (Signed)
Please inform patient of the following:  His thyroid is a little off and everything else is normal.  Would like for him to come back in a few weeks to recheck TSH, free T3, and free T4.  Do not need to make any other changes at this time.  I will see him back in about 6 months or so to recheck A1c.

## 2020-10-16 ENCOUNTER — Other Ambulatory Visit: Payer: Self-pay | Admitting: *Deleted

## 2020-10-16 DIAGNOSIS — R899 Unspecified abnormal finding in specimens from other organs, systems and tissues: Secondary | ICD-10-CM

## 2020-10-20 ENCOUNTER — Other Ambulatory Visit: Payer: Self-pay

## 2020-10-20 ENCOUNTER — Telehealth: Payer: Self-pay

## 2020-10-20 DIAGNOSIS — R7989 Other specified abnormal findings of blood chemistry: Secondary | ICD-10-CM

## 2020-10-20 NOTE — Telephone Encounter (Signed)
Patient's daughter is calling in wondering if someone is able to go over her dad's test results with her.

## 2020-10-26 ENCOUNTER — Other Ambulatory Visit: Payer: Medicare HMO

## 2020-11-02 ENCOUNTER — Other Ambulatory Visit: Payer: Self-pay

## 2020-11-02 ENCOUNTER — Encounter: Payer: Self-pay | Admitting: Family Medicine

## 2020-11-02 ENCOUNTER — Other Ambulatory Visit (INDEPENDENT_AMBULATORY_CARE_PROVIDER_SITE_OTHER): Payer: Medicare HMO

## 2020-11-02 DIAGNOSIS — R7989 Other specified abnormal findings of blood chemistry: Secondary | ICD-10-CM

## 2020-11-02 DIAGNOSIS — E038 Other specified hypothyroidism: Secondary | ICD-10-CM | POA: Insufficient documentation

## 2020-11-02 LAB — TSH: TSH: 7.54 u[IU]/mL — ABNORMAL HIGH (ref 0.35–4.50)

## 2020-11-02 LAB — T4, FREE: Free T4: 0.75 ng/dL (ref 0.60–1.60)

## 2020-11-02 LAB — T3, FREE: T3, Free: 3.7 pg/mL (ref 2.3–4.2)

## 2020-11-02 NOTE — Progress Notes (Signed)
Please inform patient of the following:  He has subclinical hypothyroidism. We should recheck in 6 months.  Do not need to start any medications at this point.   [name]. [name], MD 11/02/2020 3:40 PM

## 2020-11-16 IMAGING — DX DG LUMBAR SPINE COMPLETE 4+V
5 series · 5 of 5 positions shown · non-contrast
Comparison: None.

CLINICAL DATA: Lower back pain x 2 mos, NKI. Pt do not speak
english so unable to get more information.

EXAM:
LUMBAR SPINE - COMPLETE 4+ VIEW

[l-spine ap]
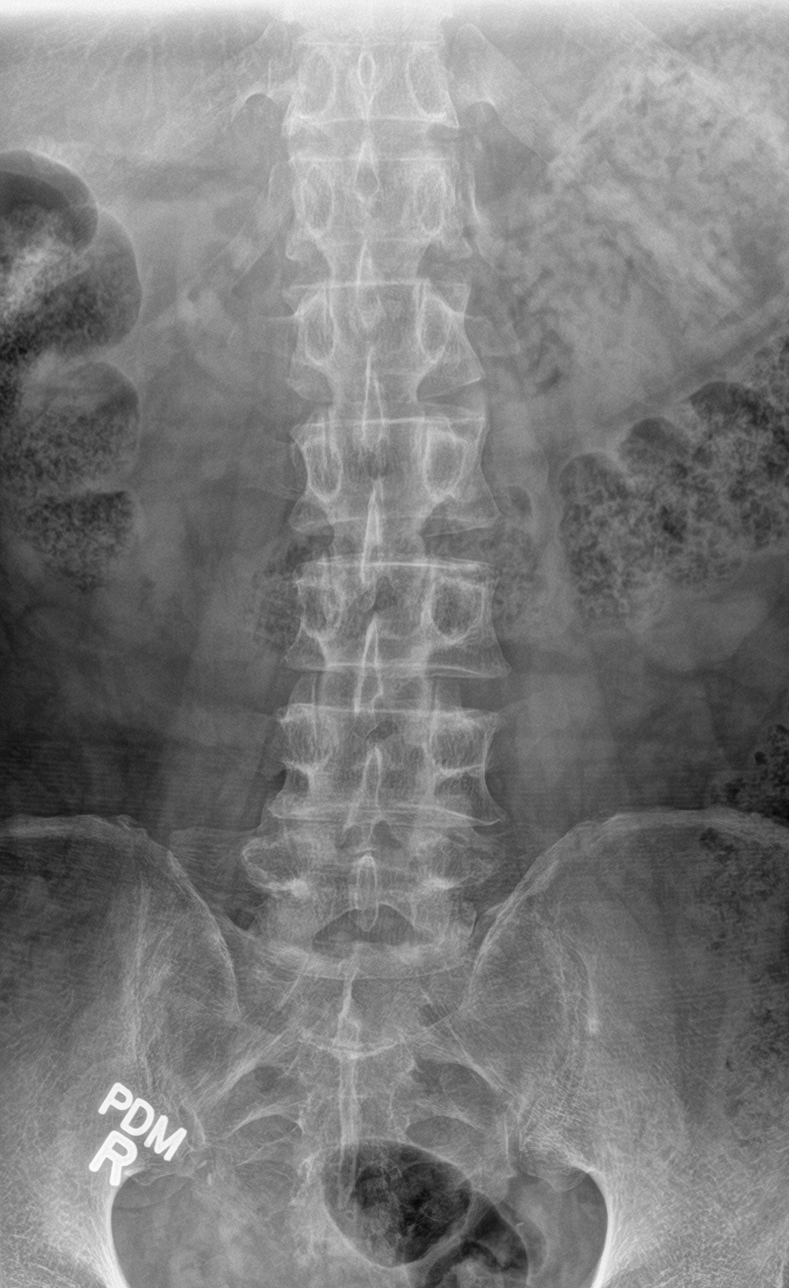

[l-spine obl (1 of 2)]
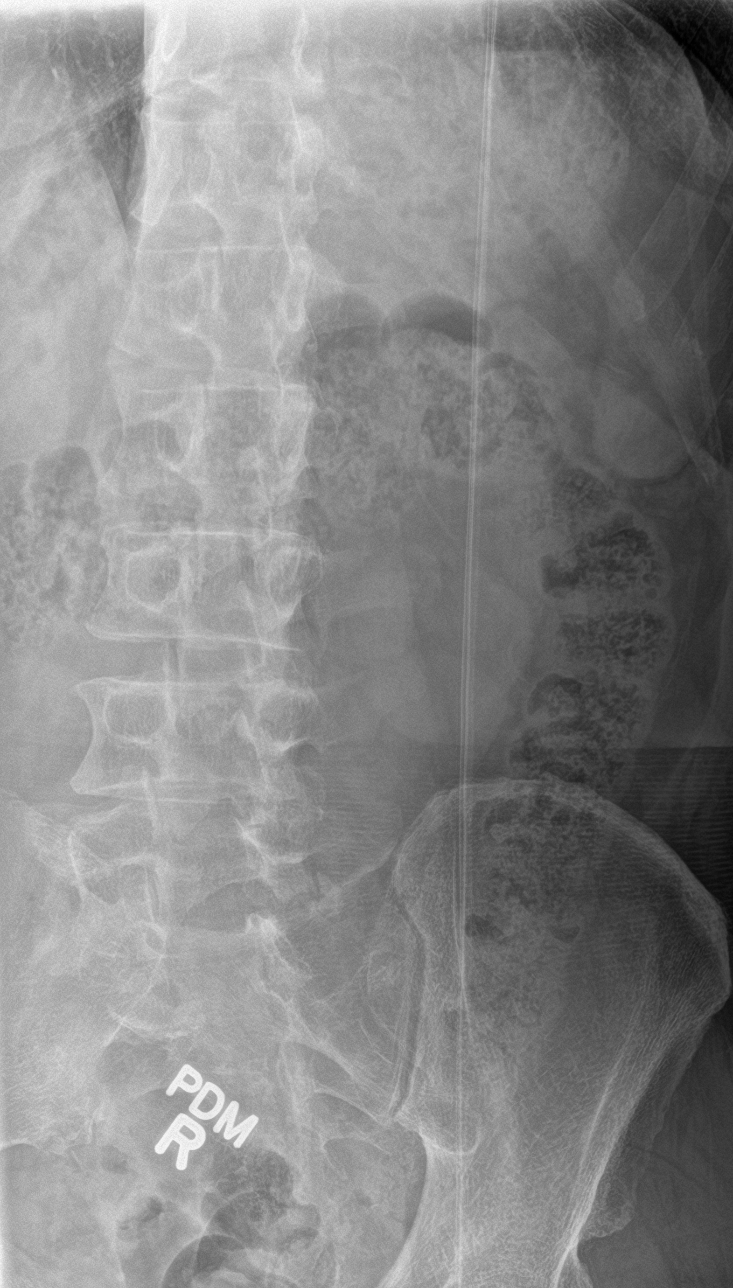

[l-spine obl (2 of 2)]
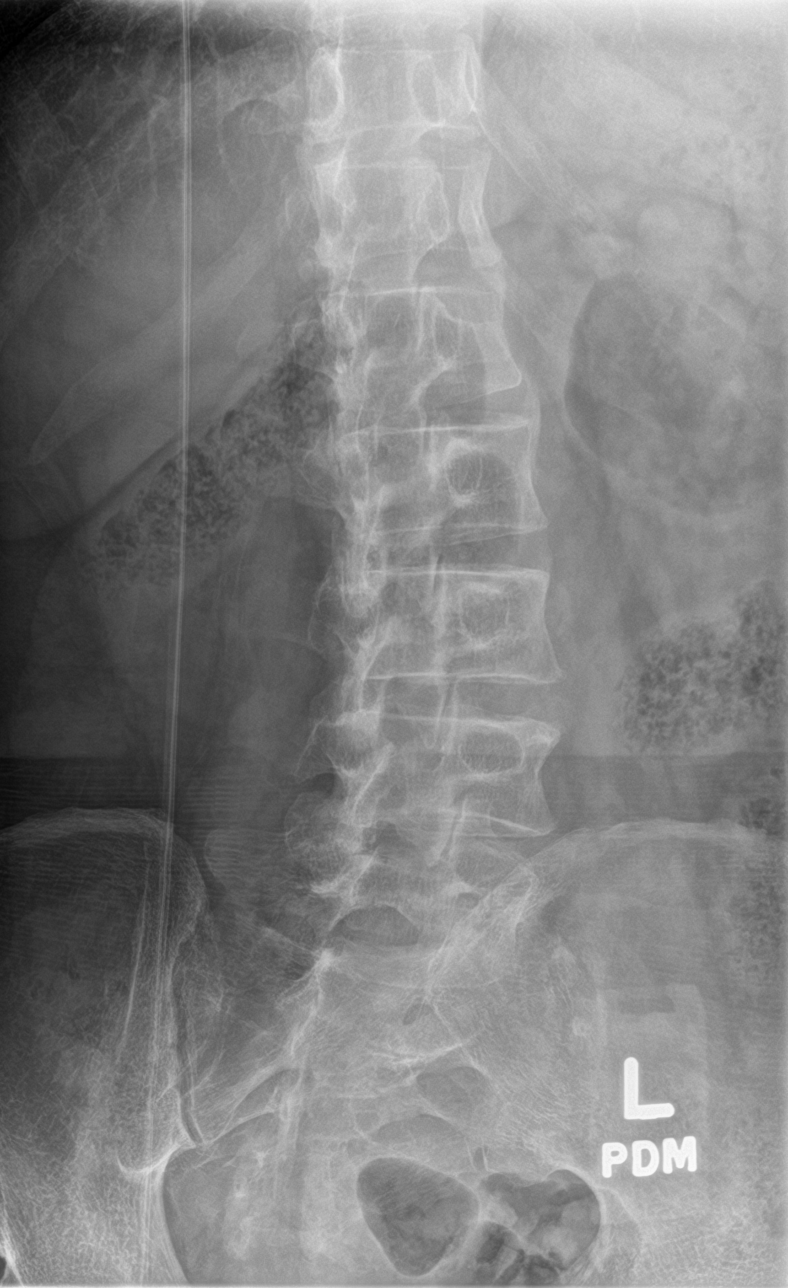

[l-spine lat]
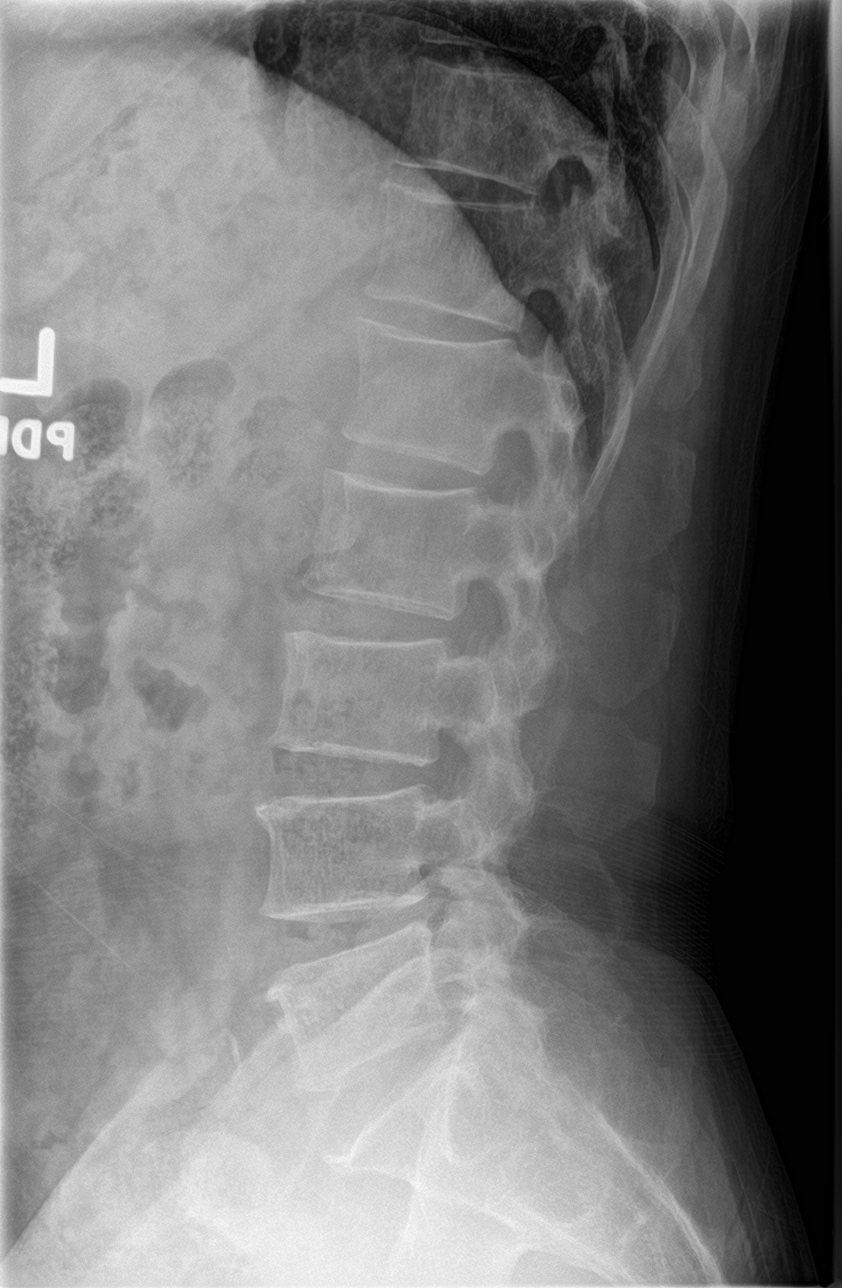

[l-spine spot]
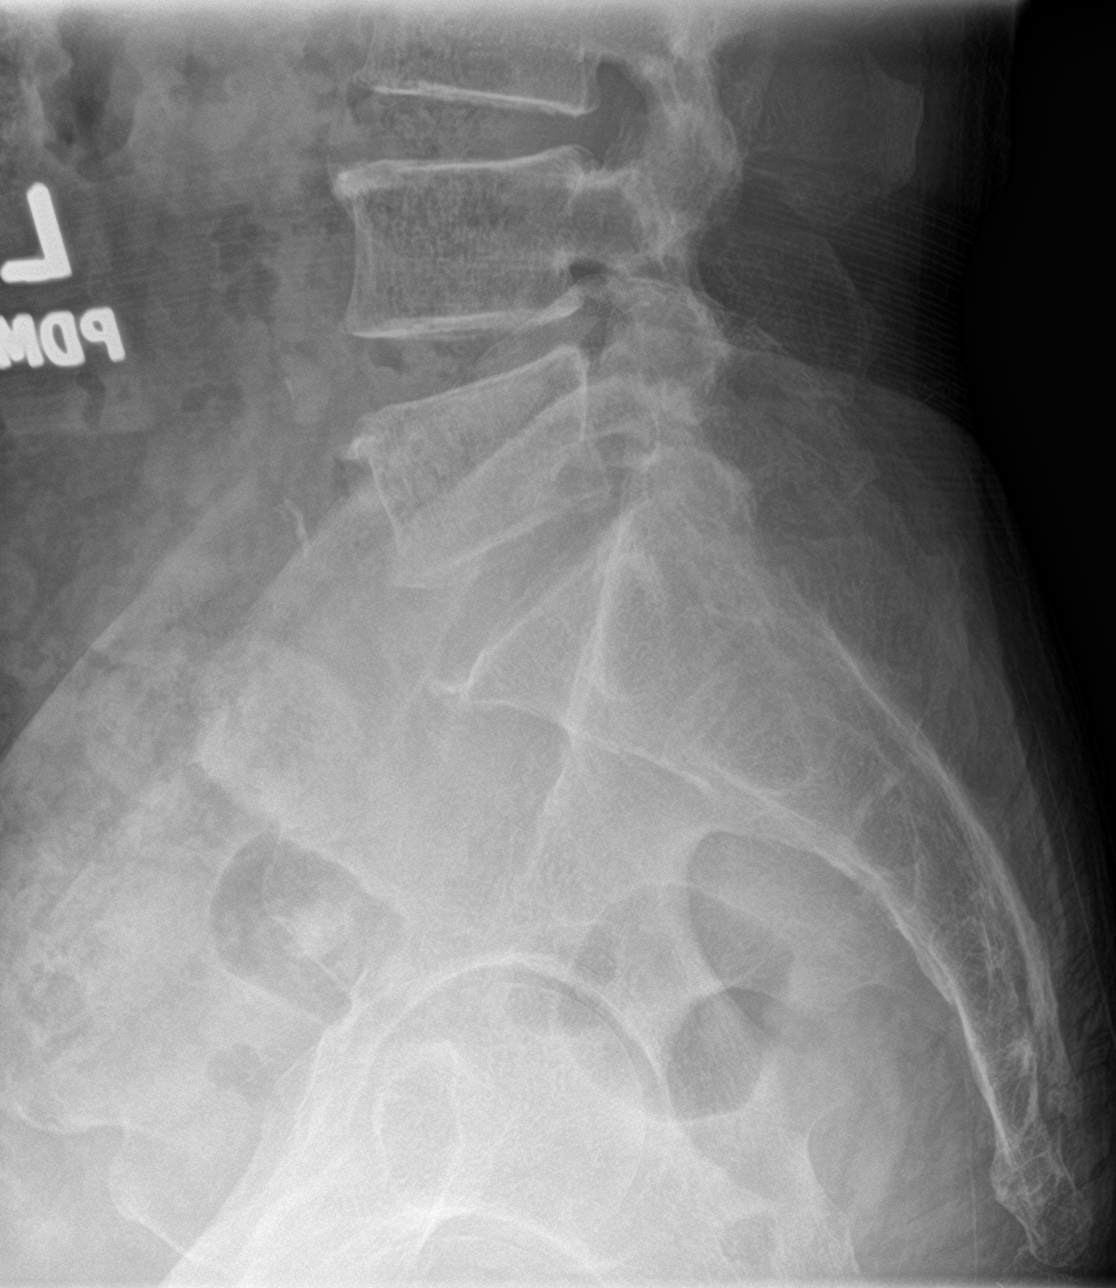

[5 of 5 positions shown; findings below may reference images not displayed]

FINDINGS: Mild levocurvature. Normal alignment. Vertebral body heights and
intervertebral disc spaces are maintained. Mild multilevel anterior
spurring. Lower lumbar facet arthrosis. SI joints are open and
symmetric. Nonobstructive bowel gas pattern.
IMPRESSION: No acute osseous abnormality. Mild degenerative changes in the
lumbar spine.

## 2020-11-23 ENCOUNTER — Ambulatory Visit: Payer: Medicare HMO

## 2020-11-30 ENCOUNTER — Ambulatory Visit (INDEPENDENT_AMBULATORY_CARE_PROVIDER_SITE_OTHER): Payer: Medicare HMO

## 2020-11-30 ENCOUNTER — Other Ambulatory Visit: Payer: Self-pay

## 2020-11-30 VITALS — BP 138/76 | HR 86 | Temp 98.1°F | Wt 158.8 lb

## 2020-11-30 DIAGNOSIS — Z0000 Encounter for general adult medical examination without abnormal findings: Secondary | ICD-10-CM

## 2020-11-30 NOTE — Patient Instructions (Addendum)
Richard Reeves , Thank you for taking time to come for your Medicare Wellness Visit. I appreciate your ongoing commitment to your health goals. Please review the following plan we discussed and let me know if I can assist you in the future.   Screening recommendations/referrals: Colonoscopy: Done 11/01/18 Recommended yearly ophthalmology/optometry visit for glaucoma screening and checkup Recommended yearly dental visit for hygiene and checkup  Vaccinations: Influenza vaccine: Up to date Pneumococcal vaccine: Up to date Tdap vaccine: Up to date Shingles vaccine: 04/19/19  & 06/19/19 Covid-19: Competed 2/21, 3/17, & 05/25/20  Advanced directives: Advance directive discussed with you today. I have provided a copy for you to complete at home and have notarized. Once this is complete please bring a copy in to our office so we can scan it into your chart.  Conditions/risks identified: None at this time  Next appointment: Follow up in one year for your annual wellness visit.   Preventive Care 68 Years and Older, Male Preventive care refers to lifestyle choices and visits with your health care provider that can promote health and wellness. What does preventive care include?  A yearly physical exam. This is also called an annual well check.  Dental exams once or twice a year.  Routine eye exams. Ask your health care provider how often you should have your eyes checked.  Personal lifestyle choices, including:  Daily care of your teeth and gums.  Regular physical activity.  Eating a healthy diet.  Avoiding tobacco and drug use.  Limiting alcohol use.  Practicing safe sex.  Taking low doses of aspirin every day.  Taking vitamin and mineral supplements as recommended by your health care provider. What happens during an annual well check? The services and screenings done by your health care provider during your annual well check will depend on your age, overall health, lifestyle risk factors,  and family history of disease. Counseling  Your health care provider may ask you questions about your:  Alcohol use.  Tobacco use.  Drug use.  Emotional well-being.  Home and relationship well-being.  Sexual activity.  Eating habits.  History of falls.  Memory and ability to understand (cognition).  Work and work Statistician. Screening  You may have the following tests or measurements:  Height, weight, and BMI.  Blood pressure.  Lipid and cholesterol levels. These may be checked every 5 years, or more frequently if you are over 60 years old.  Skin check.  Lung cancer screening. You may have this screening every year starting at age 71 if you have a 30-pack-year history of smoking and currently smoke or have quit within the past 15 years.  Fecal occult blood test (FOBT) of the stool. You may have this test every year starting at age 26.  Flexible sigmoidoscopy or colonoscopy. You may have a sigmoidoscopy every 5 years or a colonoscopy every 10 years starting at age 50.  Prostate cancer screening. Recommendations will vary depending on your family history and other risks.  Hepatitis C blood test.  Hepatitis B blood test.  Sexually transmitted disease (STD) testing.  Diabetes screening. This is done by checking your blood sugar (glucose) after you have not eaten for a while (fasting). You may have this done every 1-3 years.  Abdominal aortic aneurysm (AAA) screening. You may need this if you are a current or former smoker.  Osteoporosis. You may be screened starting at age 66 if you are at high risk. Talk with your health care provider about your test results,  treatment options, and if necessary, the need for more tests. Vaccines  Your health care provider may recommend certain vaccines, such as:  Influenza vaccine. This is recommended every year.  Tetanus, diphtheria, and acellular pertussis (Tdap, Td) vaccine. You may need a Td booster every 10  years.  Zoster vaccine. You may need this after age 20.  Pneumococcal 13-valent conjugate (PCV13) vaccine. One dose is recommended after age 16.  Pneumococcal polysaccharide (PPSV23) vaccine. One dose is recommended after age 7. Talk to your health care provider about which screenings and vaccines you need and how often you need them. This information is not intended to replace advice given to you by your health care provider. Make sure you discuss any questions you have with your health care provider. Document Released: 07/31/2015 Document Revised: 03/23/2016 Document Reviewed: 05/05/2015 Elsevier Interactive Patient Education  2017 [place] Prevention in the Home Falls can cause injuries. They can happen to people of all ages. There are many things you can do to make your home safe and to help prevent falls. What can I do on the outside of my home?  Regularly fix the edges of walkways and driveways and fix any cracks.  Remove anything that might make you trip as you walk through a door, such as a raised step or threshold.  Trim any bushes or trees on the path to your home.  Use bright outdoor lighting.  Clear any walking paths of anything that might make someone trip, such as rocks or tools.  Regularly check to see if handrails are loose or broken. Make sure that both sides of any steps have handrails.  Any raised decks and porches should have guardrails on the edges.  Have any leaves, snow, or ice cleared regularly.  Use sand or salt on walking paths during winter.  Clean up any spills in your garage right away. This includes oil or grease spills. What can I do in the bathroom?  Use night lights.  Install grab bars by the toilet and in the tub and shower. Do not use towel bars as grab bars.  Use non-skid mats or decals in the tub or shower.  If you need to sit down in the shower, use a plastic, non-slip stool.  Keep the floor dry. Clean up any water that  spills on the floor as soon as it happens.  Remove soap buildup in the tub or shower regularly.  Attach bath mats securely with double-sided non-slip rug tape.  Do not have throw rugs and other things on the floor that can make you trip. What can I do in the bedroom?  Use night lights.  Make sure that you have a light by your bed that is easy to reach.  Do not use any sheets or blankets that are too big for your bed. They should not hang down onto the floor.  Have a firm chair that has side arms. You can use this for support while you get dressed.  Do not have throw rugs and other things on the floor that can make you trip. What can I do in the kitchen?  Clean up any spills right away.  Avoid walking on wet floors.  Keep items that you use a lot in easy-to-reach places.  If you need to reach something above you, use a strong step stool that has a grab bar.  Keep electrical cords out of the way.  Do not use floor polish or wax that makes floors  slippery. If you must use wax, use non-skid floor wax.  Do not have throw rugs and other things on the floor that can make you trip. What can I do with my stairs?  Do not leave any items on the stairs.  Make sure that there are handrails on both sides of the stairs and use them. Fix handrails that are broken or loose. Make sure that handrails are as long as the stairways.  Check any carpeting to make sure that it is firmly attached to the stairs. Fix any carpet that is loose or worn.  Avoid having throw rugs at the top or bottom of the stairs. If you do have throw rugs, attach them to the floor with carpet tape.  Make sure that you have a light switch at the top of the stairs and the bottom of the stairs. If you do not have them, ask someone to add them for you. What else can I do to help prevent falls?  Wear shoes that:  Do not have high heels.  Have rubber bottoms.  Are comfortable and fit you well.  Are closed at the  toe. Do not wear sandals.  If you use a stepladder:  Make sure that it is fully opened. Do not climb a closed stepladder.  Make sure that both sides of the stepladder are locked into place.  Ask someone to hold it for you, if possible.  Clearly mark and make sure that you can see:  Any grab bars or handrails.  First and last steps.  Where the edge of each step is.  Use tools that help you move around (mobility aids) if they are needed. These include:  Canes.  Walkers.  Scooters.  Crutches.  Turn on the lights when you go into a dark area. Replace any light bulbs as soon as they burn out.  Set up your furniture so you have a clear path. Avoid moving your furniture around.  If any of your floors are uneven, fix them.  If there are any pets around you, be aware of where they are.  Review your medicines with your doctor. Some medicines can make you feel dizzy. This can increase your chance of falling. Ask your doctor what other things that you can do to help prevent falls. This information is not intended to replace advice given to you by your health care provider. Make sure you discuss any questions you have with your health care provider. Document Released: 04/30/2009 Document Revised: 12/10/2015 Document Reviewed: 08/08/2014 Elsevier Interactive Patient Education  2017 [employer].

## 2020-11-30 NOTE — Progress Notes (Addendum)
Subjective:   Richard Reeves is a 70 y.o. male who presents for an Initial Medicare Annual Wellness Visit.  Review of Systems     Cardiac Risk Factors include: advanced age (>19men, >51 women);dyslipidemia;male gender     Objective:    Today's Vitals   11/30/20 1501  BP: 138/76  Pulse: 86  Temp: 98.1 F (36.7 C)  SpO2: 95%  Weight: 158 lb 12.8 oz (72 kg)   Body mass index is 24.15 kg/m.  Advanced Directives 11/30/2020 01/01/2016  Does Patient Have a Medical Advance Directive? No No  Would patient like information on creating a medical advance directive? Yes (MAU/Ambulatory/Procedural Areas - Information given) -    Current Medications (verified) Outpatient Encounter Medications as of 11/30/2020  Medication Sig  . aspirin EC 81 MG tablet Take 81 mg by mouth daily.  . metFORMIN (GLUCOPHAGE) 1000 MG tablet TAKE 1 TABLET BY MOUTH TWICE DAILY WITH A MEAL  . pravastatin (PRAVACHOL) 20 MG tablet Take 1 tablet (20 mg total) by mouth daily.  . diclofenac (VOLTAREN) 75 MG EC tablet Take 1 tablet (75 mg total) by mouth 2 (two) times daily. (Patient not taking: Reported on 11/30/2020)   No facility-administered encounter medications on file as of 11/30/2020.    Allergies (verified) Patient has no known allergies.   History: Past Medical History:  Diagnosis Date  . Diabetes mellitus without complication ([place])   . Hyperlipidemia    History reviewed. No pertinent surgical history. Family History  Problem Relation Age of Onset  . Cancer Mother   . Colon cancer Neg Hx   . Esophageal cancer Neg Hx   . Inflammatory bowel disease Neg Hx   . Liver disease Neg Hx   . Pancreatic cancer Neg Hx   . Rectal cancer Neg Hx   . Stomach cancer Neg Hx    Social History   Socioeconomic History  . Marital status: Married    Spouse name: Not on file  . Number of children: Not on file  . Years of education: Not on file  . Highest education level: Not on file  Occupational History  . Not on  file  Tobacco Use  . Smoking status: Former Research scientist (life sciences)  . Smokeless tobacco: Never Used  Vaping Use  . Vaping Use: Never used  Substance and Sexual Activity  . Alcohol use: No    Alcohol/week: 0.0 standard drinks  . Drug use: No  . Sexual activity: Yes    Partners: Female  Other Topics Concern  . Not on file  Social History Narrative  . Not on file   Social Determinants of Health   Financial Resource Strain: Low Risk   . Difficulty of Paying Living Expenses: Not hard at all  Food Insecurity: No Food Insecurity  . Worried About Charity fundraiser in the Last Year: Never true  . Ran Out of Food in the Last Year: Never true  Transportation Needs: No Transportation Needs  . Lack of Transportation (Medical): No  . Lack of Transportation (Non-Medical): No  Physical Activity: Insufficiently Active  . Days of Exercise per Week: 7 days  . Minutes of Exercise per Session: 10 min  Stress: No Stress Concern Present  . Feeling of Stress : Not at all  Social Connections: Moderately Isolated  . Frequency of Communication with Friends and Family: More than three times a week  . Frequency of Social Gatherings with Friends and Family: More than three times a week  . Attends Religious Services:  Never  . Active Member of Clubs or Organizations: No  . Attends Club or Organization Meetings: Never  . Marital Status: Married    Tobacco Counseling Counseling given: Not Answered   Clinical Intake:  Pre-visit preparation completed: Yes  Pain : No/denies pain     BMI - recorded: 24.15 Nutritional Status: BMI of 19-24  Normal Nutritional Risks: None Diabetes: Yes CBG done?: No Did pt. bring in CBG monitor from home?: No  How often do you need to have someone help you when you read instructions, pamphlets, or other written materials from your doctor or pharmacy?: 1 - Never  Diabetic?Nutrition Risk Assessment:  Has the patient had any N/V/D within the last 2 months?  No  Does the  patient have any non-healing wounds?  No  Has the patient had any unintentional weight loss or weight gain?  No   Diabetes:  Is the patient diabetic?  Yes  If diabetic, was a CBG obtained today?  No  Did the patient bring in their glucometer from home?  No  How often do you monitor your CBG's? N/A.   Financial Strains and Diabetes Management:  Are you having any financial strains with the device, your supplies or your medication? No .  Does the patient want to be seen by Chronic Care Management for management of their diabetes?  No  Would the patient like to be referred to a Nutritionist or for Diabetic Management?  No   Diabetic Exams:  Diabetic Eye Exam: Overdue for diabetic eye exam. Pt has been advised about the importance in completing this exam. Patient advised to call and schedule an eye exam. Diabetic Foot Exam: Overdue, Pt has been advised about the importance in completing this exam. Pt is scheduled for diabetic foot exam on next appt with Dr [name] .   Interpreter Needed?: Yes Interpreter Agency: [facility] Interpreter Name: Richard Reeves  Information entered by :: Charlott Rakes, LPN   Activities of Daily Living In your present state of health, do you have any difficulty performing the following activities: 11/30/2020 10/13/2020  Hearing? N N  Vision? N N  Difficulty concentrating or making decisions? N N  Walking or climbing stairs? N N  Dressing or bathing? N N  Doing errands, shopping? N N  Preparing Food and eating ? N -  Using the Toilet? N -  In the past six months, have you accidently leaked urine? N -  Do you have problems with loss of bowel control? N -  Managing your Medications? N -  Managing your Finances? N -  Housekeeping or managing your Housekeeping? N -  Some recent data might be hidden    Patient Care Team: Vivi Barrack, MD as PCP - General (Family Medicine)  Indicate any recent Medical Services you may have received from other than  [facility] providers in the past year (date may be approximate).     Assessment:   This is a routine wellness examination for Richard Reeves.  Hearing/Vision screen  Hearing Screening   125Hz  250Hz  500Hz  1000Hz  2000Hz  3000Hz  4000Hz  6000Hz  8000Hz   Right ear:           Left ear:           Comments: Pt denies any hearing issues   Vision Screening Comments: Pt follows up twice a year with [facility]   Dietary issues and exercise activities discussed: Current Exercise Habits: Home exercise routine, Type of exercise: stretching, Time (Minutes): 15, Frequency (Times/Week): 7,  Weekly Exercise (Minutes/Week): 105  Goals Addressed            This Visit's Progress   . Patient Stated       None at this time      Depression Screen PHQ 2/9 Scores 11/30/2020 10/13/2020 05/06/2019 05/06/2019 07/10/2017 01/01/2016 09/23/2015  PHQ - 2 Score 0 0 0 0 0 0 0    Fall Risk Fall Risk  11/30/2020 10/13/2020 05/06/2019 05/06/2019 07/10/2017  Falls in the past year? 0 0 0 0 No  Number falls in past yr: 0 - - - -  Injury with Fall? 0 - - - -  Risk for fall due to : Impaired vision - - - -  Follow up Falls prevention discussed - - - -    FALL RISK PREVENTION PERTAINING TO THE HOME:  Any stairs in or around the home? Yes  If so, are there any without handrails? No  Home free of loose throw rugs in walkways, pet beds, electrical cords, etc? Yes  Adequate lighting in your home to reduce risk of falls? Yes   ASSISTIVE DEVICES UTILIZED TO PREVENT FALLS:  Life alert? No  Use of a cane, walker or w/c? No  Grab bars in the bathroom? No  Shower chair or bench in shower? No  Elevated toilet seat or a handicapped toilet? No   TIMED UP AND GO:  Was the test performed? Yes .  Length of time to ambulate 10 feet: 10 sec.   Gait steady and fast without use of assistive device  Cognitive Function:     6CIT Screen 11/30/2020  What Year? 0 points  What month? 0 points  Count back from 20 0 points  Months in  reverse 0 points  Repeat phrase 0 points    Immunizations Immunization History  Administered Date(s) Administered  . Fluad Quad(high Dose 65+) 03/30/2019, 04/16/2020  . Hepb-cpg 12/11/2018, 01/22/2019  . Influenza,inj,Quad PF,6+ Mos 07/24/2018  . PFIZER(Purple Top)SARS-COV-2 Vaccination 09/08/2019, 10/02/2019, 05/25/2020  . Pneumococcal Conjugate-13 07/24/2018, 03/30/2019  . Pneumococcal Polysaccharide-23 10/13/2020  . Tdap 01/01/2016  . Zoster Recombinat (Shingrix) 04/19/2019, 06/19/2019    TDAP status: Up to date  Flu Vaccine status: Up to date  Pneumococcal vaccine status: Up to date  Covid-19 vaccine status: Completed vaccines  Qualifies for Shingles Vaccine? Yes   Zostavax completed Yes   Shingrix Completed?: Yes  Screening Tests Health Maintenance  Topic Date Due  . OPHTHALMOLOGY EXAM  Never done  . FOOT EXAM  05/05/2020  . INFLUENZA VACCINE  02/15/2021  . HEMOGLOBIN A1C  04/15/2021  . URINE MICROALBUMIN  10/13/2021  . COLONOSCOPY (Pts 45-58yrs Insurance coverage will need to be confirmed)  10/31/2025  . TETANUS/TDAP  12/31/2025  . COVID-19 Vaccine  Completed  . Hepatitis C Screening  Completed  . PNA vac Low Risk Adult  Completed  . HPV VACCINES  Aged Out    Health Maintenance  Health Maintenance Due  Topic Date Due  . OPHTHALMOLOGY EXAM  Never done  . FOOT EXAM  05/05/2020    Colorectal cancer screening: Type of screening: Colonoscopy. Completed 10/18/18. Repeat every 7 years   Additional Screening:  Hepatitis C Screening:  Completed 07/24/18  Vision Screening: Recommended annual ophthalmology exams for early detection of glaucoma and other disorders of the eye. Is the patient up to date with their annual eye exam?  Yes  Who is the provider or what is the name of the office in which the patient attends annual eye  exams? [facility]  If pt is not established with a provider, would they like to be referred to a provider to establish care? No .    Dental Screening: Recommended annual dental exams for proper oral hygiene  Community Resource Referral / Chronic Care Management: CRR required this visit?  No   CCM required this visit?  No      Plan:     I have personally reviewed and noted the following in the patient's chart:   . Medical and social history . Use of alcohol, tobacco or illicit drugs  . Current medications and supplements including opioid prescriptions. Patient is not currently taking opioid prescriptions. . Functional ability and status . Nutritional status . Physical activity . Advanced directives . List of other physicians . Hospitalizations, surgeries, and ER visits in previous 12 months . Vitals . Screenings to include cognitive, depression, and falls . Referrals and appointments  In addition, I have reviewed and discussed with patient certain preventive protocols, quality metrics, and best practice recommendations. A written personalized care plan for preventive services as well as general preventive health recommendations were provided to patient.     Willette Brace, LPN   2/29/7989   Nurse Notes: None

## 2021-01-20 ENCOUNTER — Ambulatory Visit (INDEPENDENT_AMBULATORY_CARE_PROVIDER_SITE_OTHER)
Admission: RE | Admit: 2021-01-20 | Discharge: 2021-01-20 | Disposition: A | Discharge status: Home / Self Care | Payer: Medicare HMO | Source: Ambulatory Visit | Attending: Family | Admitting: Family

## 2021-01-20 ENCOUNTER — Ambulatory Visit (INDEPENDENT_AMBULATORY_CARE_PROVIDER_SITE_OTHER): Payer: Medicare HMO | Admitting: Family

## 2021-01-20 ENCOUNTER — Other Ambulatory Visit: Payer: Self-pay

## 2021-01-20 ENCOUNTER — Encounter: Payer: Self-pay | Admitting: Family

## 2021-01-20 DIAGNOSIS — R0789 Other chest pain: Secondary | ICD-10-CM | POA: Diagnosis not present

## 2021-01-20 DIAGNOSIS — M25561 Pain in right knee: Secondary | ICD-10-CM | POA: Diagnosis not present

## 2021-01-20 MED ORDER — CYCLOBENZAPRINE HCL 5 MG PO TABS: 5.0000 mg | ORAL_TABLET | Freq: Three times a day (TID) | ORAL | 0 refills | Status: DC | PRN

## 2021-01-20 MED ORDER — MELOXICAM 7.5 MG PO TABS: 7.5000 mg | ORAL_TABLET | Freq: Every day | ORAL | 0 refills | Status: DC
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
## 2021-01-22 ENCOUNTER — Telehealth: Payer: Self-pay

## 2021-01-22 NOTE — Telephone Encounter (Signed)
Patient's daughter called and was asking about X-Ray results on knee. Looked at X-Ray and no provider has viewed it yet. She also stated that he plans to go out of town next Friday and would like to know if he would need to cancel the trip. Can someone call her back?

## 2021-01-25 NOTE — Telephone Encounter (Signed)
I did not see him for this but his xray shows no fracture or dislocation or any other obvious abnormality.  Richard Reeves. [name], MD 01/25/2021 8:25 AM

## 2021-01-25 NOTE — Telephone Encounter (Signed)
See below

## 2021-01-25 NOTE — Telephone Encounter (Signed)
Returned call to pt daughter and gave below message and made her aware that 2 Rx were sent to the pharmacy on 01/20/21. Pt daughter states she will let the pt know.

## 2021-02-22 ENCOUNTER — Telehealth: Payer: Self-pay

## 2021-02-22 NOTE — Telephone Encounter (Signed)
Please scheduled appointment with pt.  Thank You

## 2021-02-22 NOTE — Telephone Encounter (Signed)
Nurse: Zenia Resides, RN, Diane Date/Time Eilene Ghazi Time): 02/22/2021 9:36:37 AM Confirm and document reason for call. If symptomatic, describe symptoms. ---Caller states his blood pressure is Q000111Q or 0000000 systolic. Not on any medication. Normal is Q000111Q [id number] systolic. No current symptoms. Does the patient have any new or worsening symptoms? ---Yes Will a triage be completed? ---Yes Related visit to physician within the last 2 weeks? ---No Does the PT have any chronic conditions? (i.e. diabetes, asthma, this includes High risk factors for pregnancy, etc.) ---Yes List chronic conditions. ---DM, high cholesterol Is this a behavioral health or substance abuse call? ---No Guidelines Guideline Title Affirmed Question Affirmed Notes Nurse Date/Time (Eastern Time) Blood Pressure - High Systolic BP >= 0000000 OR Diastolic >= 123XX123 Zenia Resides, RN, Diane 02/22/2021 9:40:31 AM Disp. Time Eilene Ghazi Time) Disposition Final User 02/22/2021 9:43:16 AM SEE PCP WITHIN 3 DAYS Yes Zenia Resides, RN, Diane PLEASE NOTE: All timestamps contained within this report are represented as [place] Standard Time. CONFIDENTIALTY NOTICE: This fax transmission is intended only for the addressee. It contains information that is legally privileged, confidential or otherwise protected from use or disclosure. If you are not the intended recipient, you are strictly prohibited from reviewing, disclosing, copying using or disseminating any of this information or taking any action in reliance on or regarding this information. If you have received this fax in error, please notify us immediately by telephone so that we can arrange for its return to [place]. Phone: [phone], Toll-Free: [phone], Fax: [phone] Page: 2 of 2 Call Id: [id number] Caller Disagree/Comply Comply Caller Understands Yes PreDisposition Call Doctor Care Advice Given Per Guideline SEE PCP WITHIN 3 DAYS: * You need to be seen within 2 or 3 days. CALL BACK IF: * Weakness or numbness of  the face, arm or leg on one side of the body occurs * Difficulty walking, difficulty talking, or severe headache occurs * Chest pain or difficulty breathing occurs * Your blood pressure is over 180/110 CARE ADVICE given per High Blood Pressure (Adult) guideline. Comments User: Hildred Priest, RN Date/Time Eilene Ghazi Time): 02/22/2021 9:44:22 AM Pt. has an appt.at 9:20 tomorrow morning. Referrals REFERRED TO PCP OFFIC

## 2021-02-23 ENCOUNTER — Other Ambulatory Visit: Payer: Self-pay

## 2021-02-23 ENCOUNTER — Ambulatory Visit (INDEPENDENT_AMBULATORY_CARE_PROVIDER_SITE_OTHER): Payer: Medicare HMO | Admitting: Family Medicine

## 2021-02-23 ENCOUNTER — Encounter: Payer: Self-pay | Admitting: Family Medicine

## 2021-02-23 VITALS — BP 152/83 | HR 68 | Temp 97.9°F | Ht 68.0 in | Wt 152.4 lb

## 2021-02-23 DIAGNOSIS — E1169 Type 2 diabetes mellitus with other specified complication: Secondary | ICD-10-CM | POA: Diagnosis not present

## 2021-02-23 DIAGNOSIS — E785 Hyperlipidemia, unspecified: Secondary | ICD-10-CM | POA: Diagnosis not present

## 2021-02-23 DIAGNOSIS — E1159 Type 2 diabetes mellitus with other circulatory complications: Secondary | ICD-10-CM | POA: Diagnosis not present

## 2021-02-23 DIAGNOSIS — M25561 Pain in right knee: Secondary | ICD-10-CM | POA: Diagnosis not present

## 2021-02-23 DIAGNOSIS — E038 Other specified hypothyroidism: Secondary | ICD-10-CM

## 2021-02-23 DIAGNOSIS — E119 Type 2 diabetes mellitus without complications: Secondary | ICD-10-CM | POA: Diagnosis not present

## 2021-02-23 DIAGNOSIS — I152 Hypertension secondary to endocrine disorders: Secondary | ICD-10-CM | POA: Diagnosis not present

## 2021-02-23 LAB — COMPREHENSIVE METABOLIC PANEL
ALT: 13 U/L (ref 0–53)
AST: 22 U/L (ref 0–37)
Albumin: 4.6 g/dL (ref 3.5–5.2)
Alkaline Phosphatase: 63 U/L (ref 39–117)
BUN: 18 mg/dL (ref 6–23)
CO2: 28 mEq/L (ref 19–32)
Calcium: 9.5 mg/dL (ref 8.4–10.5)
Chloride: 102 mEq/L (ref 96–112)
Creatinine, Ser: 0.89 mg/dL (ref 0.40–1.50)
GFR: 88.54 mL/min (ref >60.00)
Glucose, Bld: 96 mg/dL (ref 70–99)
Potassium: 4 mEq/L (ref 3.5–5.1)
Sodium: 139 mEq/L (ref 135–145)
Total Bilirubin: 2.7 mg/dL — ABNORMAL HIGH (ref 0.2–1.2)
Total Protein: 7.3 g/dL (ref 6.0–8.3)

## 2021-02-23 LAB — CBC
HCT: 39.1 % (ref 39.0–52.0)
Hemoglobin: 13.3 g/dL (ref 13.0–17.0)
MCHC: 34.1 g/dL (ref 30.0–36.0)
MCV: 91.1 fl (ref 78.0–100.0)
Platelets: 183 10*3/uL (ref 150.0–400.0)
RBC: 4.29 Mil/uL (ref 4.22–5.81)
RDW: 13 % (ref 11.5–15.5)
WBC: 3.6 10*3/uL — ABNORMAL LOW (ref 4.0–10.5)

## 2021-02-23 LAB — T4, FREE: Free T4: 0.83 ng/dL (ref 0.60–1.60)

## 2021-02-23 LAB — T3, FREE: T3, Free: 3.7 pg/mL (ref 2.3–4.2)

## 2021-02-23 LAB — TSH: TSH: 4.69 u[IU]/mL (ref 0.35–5.50)

## 2021-02-23 LAB — HEMOGLOBIN A1C: Hgb A1c MFr Bld: 6.8 % — ABNORMAL HIGH (ref 4.6–6.5)

## 2021-02-23 MED ORDER — MELOXICAM 7.5 MG PO TABS: 7.5000 mg | ORAL_TABLET | Freq: Every day | ORAL | 0 refills | Status: DC

## 2021-02-23 MED ORDER — LOSARTAN POTASSIUM 50 MG PO TABS: 50.0000 mg | ORAL_TABLET | Freq: Every day | ORAL | 3 refills | Status: DC
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
## 2021-02-23 NOTE — Assessment & Plan Note (Signed)
Continue pravastatin 20 mg daily. 

## 2021-02-23 NOTE — Patient Instructions (Signed)
It was very nice to see you today!  Please start losartan 50 mg daily.  I will refill your meloxicam.  We will check blood work today.  Please keep an eye on your blood pressure next couple weeks and come back in for blood pressure check or give [place] a call with your blood pressure readings at home..  Take care, Dr [name]  PLEASE NOTE:  If you had any lab tests please let us know if you have not heard back within a few days. You may see your results on mychart before we have a chance to review them but we will give you a call once they are reviewed by [place]. If we ordered any referrals today, please let us know if you have not heard from their office within the next week.   Please try these tips to maintain a healthy lifestyle:  Eat at least 3 REAL meals and 1-2 snacks per day.  Aim for no more than 5 hours between eating.  If you eat breakfast, please do so within one hour of getting up.   Each meal should contain half fruits/vegetables, one quarter protein, and one quarter carbs (no bigger than a computer mouse)  Cut down on sweet beverages. This includes juice, soda, and sweet tea.   Drink at least 1 glass of water with each meal and aim for at least 8 glasses per day  Exercise at least 150 minutes every week.

## 2021-02-23 NOTE — Assessment & Plan Note (Signed)
Check TSH, T3, T4.

## 2021-02-23 NOTE — Assessment & Plan Note (Signed)
Check A1c.  Continue metformin 1000 mg twice daily. 

## 2021-02-23 NOTE — Telephone Encounter (Signed)
Patient is scheduled   

## 2021-02-23 NOTE — Assessment & Plan Note (Signed)
Elevated today.  Would like to start treatment.  We will start losartan 50 mg daily.  Check labs.  Follow-up in a couple of weeks.

## 2021-02-24 NOTE — Progress Notes (Signed)
Please inform patient of the following:  His A1c is stable.  He does have a bit of an elevation in his bilirubin level.  I would like for him to come back in 1 to 2 weeks to recheck c-Met.  Everything else is stable.  Please place future order.

## 2021-02-26 ENCOUNTER — Other Ambulatory Visit: Payer: Self-pay

## 2021-02-26 DIAGNOSIS — R17 Unspecified jaundice: Secondary | ICD-10-CM

## 2021-03-08 ENCOUNTER — Other Ambulatory Visit: Payer: Self-pay

## 2021-03-08 ENCOUNTER — Other Ambulatory Visit (INDEPENDENT_AMBULATORY_CARE_PROVIDER_SITE_OTHER): Payer: Medicare HMO

## 2021-03-08 DIAGNOSIS — R17 Unspecified jaundice: Secondary | ICD-10-CM

## 2021-03-08 LAB — COMPREHENSIVE METABOLIC PANEL
ALT: 15 U/L (ref 0–53)
AST: 23 U/L (ref 0–37)
Albumin: 4.5 g/dL (ref 3.5–5.2)
Alkaline Phosphatase: 60 U/L (ref 39–117)
BUN: 24 mg/dL — ABNORMAL HIGH (ref 6–23)
CO2: 25 mEq/L (ref 19–32)
Calcium: 9.1 mg/dL (ref 8.4–10.5)
Chloride: 104 mEq/L (ref 96–112)
Creatinine, Ser: 0.93 mg/dL (ref 0.40–1.50)
GFR: 84.81 mL/min (ref >60.00)
Glucose, Bld: 93 mg/dL (ref 70–99)
Potassium: 3.6 mEq/L (ref 3.5–5.1)
Sodium: 139 mEq/L (ref 135–145)
Total Bilirubin: 2.1 mg/dL — ABNORMAL HIGH (ref 0.2–1.2)
Total Protein: 7.3 g/dL (ref 6.0–8.3)

## 2021-03-08 NOTE — Progress Notes (Signed)
Please inform patient of the following:  Liver numbers are back to his baseline.  We can recheck again He comes in for blood work.

## 2021-03-16 ENCOUNTER — Encounter: Payer: Self-pay | Admitting: Family Medicine

## 2021-03-16 ENCOUNTER — Ambulatory Visit (INDEPENDENT_AMBULATORY_CARE_PROVIDER_SITE_OTHER): Payer: Medicare HMO | Admitting: Family Medicine

## 2021-03-16 ENCOUNTER — Other Ambulatory Visit: Payer: Self-pay

## 2021-03-16 VITALS — BP 123/70 | HR 60 | Temp 97.6°F | Ht 68.0 in | Wt 152.6 lb

## 2021-03-16 DIAGNOSIS — E1159 Type 2 diabetes mellitus with other circulatory complications: Secondary | ICD-10-CM | POA: Diagnosis not present

## 2021-03-16 DIAGNOSIS — I152 Hypertension secondary to endocrine disorders: Secondary | ICD-10-CM | POA: Diagnosis not present

## 2021-03-16 DIAGNOSIS — M25569 Pain in unspecified knee: Secondary | ICD-10-CM | POA: Diagnosis not present

## 2021-03-16 DIAGNOSIS — E119 Type 2 diabetes mellitus without complications: Secondary | ICD-10-CM

## 2021-03-16 NOTE — Patient Instructions (Signed)
It was very nice to see you today!  Your blood pressure looks great today.  No medication changes.  We will continue your current regimen.  You can try taking glucosamine-chondroitin for your knees.  I will see back in 6 months to recheck your A1c.  Please come back to see me sooner if needed.  Take care, Dr [name]  PLEASE NOTE:  If you had any lab tests please let us know if you have not heard back within a few days. You may see your results on mychart before we have a chance to review them but we will give you a call once they are reviewed by [place]. If we ordered any referrals today, please let us know if you have not heard from their office within the next week.   Please try these tips to maintain a healthy lifestyle:  Eat at least 3 REAL meals and 1-2 snacks per day.  Aim for no more than 5 hours between eating.  If you eat breakfast, please do so within one hour of getting up.   Each meal should contain half fruits/vegetables, one quarter protein, and one quarter carbs (no bigger than a computer mouse)  Cut down on sweet beverages. This includes juice, soda, and sweet tea.   Drink at least 1 glass of water with each meal and aim for at least 8 glasses per day  Exercise at least 150 minutes every week.

## 2021-03-16 NOTE — Assessment & Plan Note (Signed)
Doing well with meloxicam 7.5 daily as needed.  Takes once or twice weekly.  Also recommended glucosamine supplementation.

## 2021-03-16 NOTE — Assessment & Plan Note (Signed)
A1c at goal on metformin 1000 mg twice daily.  Recheck 3 to 6 months.

## 2021-03-16 NOTE — Assessment & Plan Note (Signed)
On losartan 50 mg daily.  Recently had labs which were normal.  Blood pressure readings in the 120s over 60s 70s.  No side effects.

## 2021-03-16 NOTE — Assessment & Plan Note (Signed)
Stable on last blood draw.
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
## 2021-03-20 ENCOUNTER — Other Ambulatory Visit: Payer: Self-pay | Admitting: Family Medicine

## 2021-03-23 ENCOUNTER — Telehealth: Payer: Self-pay

## 2021-03-23 MED ORDER — MELOXICAM 7.5 MG PO TABS: 7.5000 mg | ORAL_TABLET | Freq: Every day | ORAL | 3 refills | Status: DC

## 2021-03-23 NOTE — Telephone Encounter (Signed)
Refill sent to pharmacy.   

## 2021-03-23 NOTE — Telephone Encounter (Signed)
LAST APPOINTMENT DATE:  03/16/21  NEXT APPOINTMENT DATE: 09/21/21  MEDICATION:meloxicam (MOBIC) 7.5 MG tablet  Richard Reeves, [place] - [place]

## 2021-04-09 ENCOUNTER — Telehealth: Payer: Self-pay

## 2021-04-09 ENCOUNTER — Ambulatory Visit (INDEPENDENT_AMBULATORY_CARE_PROVIDER_SITE_OTHER): Payer: Medicare HMO | Admitting: Family Medicine

## 2021-04-09 ENCOUNTER — Other Ambulatory Visit: Payer: Self-pay

## 2021-04-09 ENCOUNTER — Encounter: Payer: Self-pay | Admitting: Family Medicine

## 2021-04-09 VITALS — BP 126/80 | HR 66 | Temp 98.0°F | Ht 68.0 in | Wt 152.4 lb

## 2021-04-09 DIAGNOSIS — G8929 Other chronic pain: Secondary | ICD-10-CM

## 2021-04-09 DIAGNOSIS — M25561 Pain in right knee: Secondary | ICD-10-CM | POA: Diagnosis not present

## 2021-04-09 NOTE — Patient Instructions (Signed)
I have placed a referral to Sports Medicine specialist to evaluate your ongoing knee pain.  We will call you with an appointment.   Use meloxicam and tylenol as needed for your pain.
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
## 2021-04-09 NOTE — Telephone Encounter (Signed)
Richard Reeves is calling in wanting to more details about what happened in todays visit, and is asking if someone is able to give her a call back.

## 2021-04-09 NOTE — Telephone Encounter (Signed)
Spoke with patients daughter and clarified what happened with todays visit.

## 2021-04-13 ENCOUNTER — Ambulatory Visit: Payer: Medicare HMO | Admitting: Family Medicine

## 2021-04-13 ENCOUNTER — Ambulatory Visit: Payer: Self-pay

## 2021-04-13 ENCOUNTER — Other Ambulatory Visit: Payer: Self-pay

## 2021-04-13 VITALS — BP 124/82 | HR 72 | Ht 68.0 in | Wt 155.2 lb

## 2021-04-13 DIAGNOSIS — M25561 Pain in right knee: Secondary | ICD-10-CM
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
## 2021-04-13 NOTE — Patient Instructions (Addendum)
Thank you for coming in today.   You received a steroid injection today. Seek immediate medical attention if the joint becomes red, extremely painful, or is oozing fluid.   Please use Voltaren gel (Generic Diclofenac Gel) up to 4x daily for pain as needed.  This is available over-the-counter as both the name brand Voltaren gel and the generic diclofenac gel.   Recheck back in about 6 weeks with interpreter.

## 2021-04-27 ENCOUNTER — Ambulatory Visit: Payer: Medicare HMO | Admitting: Family Medicine
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
## 2021-05-25 ENCOUNTER — Other Ambulatory Visit: Payer: Self-pay

## 2021-05-25 ENCOUNTER — Ambulatory Visit: Payer: Medicare HMO | Admitting: Family Medicine

## 2021-05-25 ENCOUNTER — Ambulatory Visit (INDEPENDENT_AMBULATORY_CARE_PROVIDER_SITE_OTHER): Payer: Medicare HMO

## 2021-05-25 VITALS — BP 112/74 | HR 67 | Ht 68.0 in | Wt 152.0 lb

## 2021-05-25 DIAGNOSIS — M25561 Pain in right knee: Secondary | ICD-10-CM

## 2021-05-25 DIAGNOSIS — M5412 Radiculopathy, cervical region: Secondary | ICD-10-CM | POA: Diagnosis not present

## 2021-05-25 DIAGNOSIS — M542 Cervicalgia: Secondary | ICD-10-CM | POA: Diagnosis not present

## 2021-05-25 MED ORDER — PREDNISONE 50 MG PO TABS: ORAL_TABLET | ORAL | 0 refills | Status: DC

## 2021-05-25 MED ORDER — GABAPENTIN 300 MG PO CAPS: 300.0000 mg | ORAL_CAPSULE | Freq: Three times a day (TID) | ORAL | 2 refills | Status: DC | PRN

## 2021-05-25 NOTE — Patient Instructions (Addendum)
Thank you for coming in today.   Please get an Xray today before you leave   I've sent the medications to your pharmacy  Check back as needed

## 2021-05-27 NOTE — Progress Notes (Signed)
X-ray cervical spine shows arthritis changes at C6-7.  This potentially could cause a pinched nerve.  If needed we can get an MRI in the future.  Patient will need a [place] interpreter.

## 2021-09-21 ENCOUNTER — Ambulatory Visit (INDEPENDENT_AMBULATORY_CARE_PROVIDER_SITE_OTHER): Payer: Medicare HMO | Admitting: Family Medicine

## 2021-09-21 ENCOUNTER — Other Ambulatory Visit (INDEPENDENT_AMBULATORY_CARE_PROVIDER_SITE_OTHER): Payer: Medicare HMO

## 2021-09-21 ENCOUNTER — Encounter: Payer: Self-pay | Admitting: Family Medicine

## 2021-09-21 ENCOUNTER — Other Ambulatory Visit: Payer: Self-pay

## 2021-09-21 VITALS — BP 118/74 | HR 58 | Temp 98.2°F | Ht 68.0 in | Wt 158.0 lb

## 2021-09-21 DIAGNOSIS — Z125 Encounter for screening for malignant neoplasm of prostate: Secondary | ICD-10-CM

## 2021-09-21 DIAGNOSIS — E785 Hyperlipidemia, unspecified: Secondary | ICD-10-CM | POA: Diagnosis not present

## 2021-09-21 DIAGNOSIS — E1169 Type 2 diabetes mellitus with other specified complication: Secondary | ICD-10-CM

## 2021-09-21 DIAGNOSIS — I152 Hypertension secondary to endocrine disorders: Secondary | ICD-10-CM

## 2021-09-21 DIAGNOSIS — E038 Other specified hypothyroidism: Secondary | ICD-10-CM | POA: Diagnosis not present

## 2021-09-21 DIAGNOSIS — E119 Type 2 diabetes mellitus without complications: Secondary | ICD-10-CM | POA: Diagnosis not present

## 2021-09-21 DIAGNOSIS — H409 Unspecified glaucoma: Secondary | ICD-10-CM | POA: Diagnosis not present

## 2021-09-21 DIAGNOSIS — J309 Allergic rhinitis, unspecified: Secondary | ICD-10-CM | POA: Insufficient documentation

## 2021-09-21 DIAGNOSIS — E1159 Type 2 diabetes mellitus with other circulatory complications: Secondary | ICD-10-CM

## 2021-09-21 DIAGNOSIS — M25569 Pain in unspecified knee: Secondary | ICD-10-CM

## 2021-09-21 LAB — COMPREHENSIVE METABOLIC PANEL
ALT: 20 U/L (ref 0–53)
AST: 31 U/L (ref 0–37)
Albumin: 4.7 g/dL (ref 3.5–5.2)
Alkaline Phosphatase: 67 U/L (ref 39–117)
BUN: 16 mg/dL (ref 6–23)
CO2: 30 mEq/L (ref 19–32)
Calcium: 9.8 mg/dL (ref 8.4–10.5)
Chloride: 103 mEq/L (ref 96–112)
Creatinine, Ser: 1.04 mg/dL (ref 0.40–1.50)
GFR: 73.89 mL/min (ref >60.00)
Glucose, Bld: 110 mg/dL — ABNORMAL HIGH (ref 70–99)
Potassium: 4.7 mEq/L (ref 3.5–5.1)
Sodium: 139 mEq/L (ref 135–145)
Total Bilirubin: 2 mg/dL — ABNORMAL HIGH (ref 0.2–1.2)
Total Protein: 7.1 g/dL (ref 6.0–8.3)

## 2021-09-21 LAB — CBC
HCT: 40.1 % (ref 39.0–52.0)
Hemoglobin: 13.4 g/dL (ref 13.0–17.0)
MCHC: 33.5 g/dL (ref 30.0–36.0)
MCV: 92.3 fl (ref 78.0–100.0)
Platelets: 191 10*3/uL (ref 150.0–400.0)
RBC: 4.35 Mil/uL (ref 4.22–5.81)
RDW: 12.9 % (ref 11.5–15.5)
WBC: 4.1 10*3/uL (ref 4.0–10.5)

## 2021-09-21 LAB — T4, FREE: Free T4: 0.73 ng/dL (ref 0.60–1.60)

## 2021-09-21 LAB — TSH: TSH: 6.19 u[IU]/mL — ABNORMAL HIGH (ref 0.35–5.50)

## 2021-09-21 LAB — POCT GLYCOSYLATED HEMOGLOBIN (HGB A1C): Hemoglobin A1C: 6.5 % — AB (ref 4.0–5.6)

## 2021-09-21 LAB — LIPID PANEL
Cholesterol: 156 mg/dL (ref 0–200)
HDL: 51.6 mg/dL (ref >39.00)
LDL Cholesterol: 77 mg/dL (ref 0–99)
NonHDL: 104.64
Total CHOL/HDL Ratio: 3
Triglycerides: 139 mg/dL (ref 0.0–149.0)
VLDL: 27.8 mg/dL (ref 0.0–40.0)

## 2021-09-21 LAB — PSA: PSA: 2.39 ng/mL (ref 0.10–4.00)

## 2021-09-21 MED ORDER — BLOOD GLUCOSE MONITOR KIT: PACK | 0 refills | Status: AC

## 2021-09-21 MED ORDER — MOMETASONE FUROATE 50 MCG/ACT NA SUSP: 2.0000 | Freq: Every day | NASAL | 12 refills | Status: DC

## 2021-09-21 NOTE — Assessment & Plan Note (Signed)
Stable on nasonex as needed seasonally.  Will refill today. ?

## 2021-09-21 NOTE — Assessment & Plan Note (Signed)
We will refer to ophthalmology in [place].  Has previously followed with ophthalmology in [place]. ?

## 2021-09-21 NOTE — Assessment & Plan Note (Signed)
A1c well controlled on metformin 1000 mg twice daily.  Follow-up in 6 months. ?

## 2021-09-21 NOTE — Patient Instructions (Signed)
It was very nice to see you today! ? ?Your A1c looks great today. ? ?No medication changes. ? ?We will check blood work. ? ?I will refer you to see an eye doctor.  Please follow-up with sports medicine later this week for your knee. ? ? ?Take care, ?Dr [name] ? ?PLEASE NOTE: ? ?If you had any lab tests please let us know if you have not heard back within a few days. You may see your results on mychart before we have a chance to review them but we will give you a call once they are reviewed by [place]. If we ordered any referrals today, please let us know if you have not heard from their office within the next week.  ? ?Please try these tips to maintain a healthy lifestyle: ? ?Eat at least 3 REAL meals and 1-2 snacks per day.  Aim for no more than 5 hours between eating.  If you eat breakfast, please do so within one hour of getting up.  ? ?Each meal should contain half fruits/vegetables, one quarter protein, and one quarter carbs (no bigger than a computer mouse) ? ?Cut down on sweet beverages. This includes juice, soda, and sweet tea.  ? ?Drink at least 1 glass of water with each meal and aim for at least 8 glasses per day ? ?Exercise at least 150 minutes every week.   ?

## 2021-09-21 NOTE — Progress Notes (Signed)
? ?  Richard Reeves is a 71 y.o. male who presents today for an office visit.  History provided by patient via interpreter. ? ?Assessment/Plan:  ?New/Acute Problems: ?Abdominal pain ?No red flags.  Reassuring exam.  Given that he only had 1 isolated episode of pain this morning we will continue to watch waiting.  May be colonic spasms or gas..  We discussed reasons to return to care.  Follow-up as needed. ? ?Chronic Problems Addressed Today: ?Glaucoma ?We will refer to ophthalmology in [place].  Has previously followed with ophthalmology in [place]. ? ?Allergic rhinitis ?Stable on nasonex as needed seasonally.  Will refill today. ? ?Knee pain ?Follows with sports medicine.  Takes meloxicam 7.5 mg daily as needed. ? ?Subclinical hypothyroidism ?Check TSH T4. ? ?Hyperlipidemia associated with type 2 diabetes mellitus ([place]) ?On pravastatin 20 mg daily.  We will check labs today. ? ?Type 2 diabetes mellitus without complication, without long-term current use of insulin ([place]) ?A1c well controlled on metformin 1000 mg twice daily.  Follow-up in 6 months. ? ?Hypertension associated with diabetes ([place]) ?At goal today on losartan 50 mg daily. ? ? ?  ?Subjective:  ?HPI: ? ?Patient here to 6 months follow up. He has been doing well since last visit.  ? ?He is here with interpreter today. His A1c at office was  6.5 today. He is currently on Metformin 1000 mg twice daily. He is tolerating his medication well. No side effects. He will get blood work done today. He is requesting referral to eye doctor as well. No fatigue. No numbness or tingling in feet. ? ?He complain of abdominal pain. Started this morning. Located on upper abdomen. Worse with certain motions. He notes this is worse when moving and standing. He would like this to be checked today, No fever or chills. No constipation or diarrhea. ? ?He still have some knee pain. Located on right knee. He saw Dr Georgina Snell on 04/13/2022 and was given steroid injection. This has helped  with the pain.  He is requesting referral to sport medicine doctor. No reported leg swelling. ? ?   ?  ?Objective:  ?Physical Exam: ?BP 118/74 (BP Location: Right Arm)   Pulse (!) 58   Temp 98.2 ?F (36.8 ?C) (Temporal)   Ht '5\' 8"'$  (1.727 m)   Wt 158 lb (71.7 kg)   SpO2 97%   BMI 24.02 kg/m?   ?Gen: No acute distress, resting comfortably ?CV: Regular rate and rhythm with no murmurs appreciated ?Pulm: Normal work of breathing, clear to auscultation bilaterally with no crackles, wheezes, or rhonchi ?GI: Bowel sounds present.  Soft, nontender, nondistended.  No rebound or guarding. ?Neuro: Grossly normal, moves all extremities ?Psych: Normal affect and thought content ? ?   ? ? ?I,[name],acting as a Education administrator for [name], MD.,have documented all relevant documentation on the behalf of [name], MD,as directed by  [name], MD while in the presence of [name], MD.  ? ?I, [name], MD, have reviewed all documentation for this visit. The documentation on 09/21/21 for the exam, diagnosis, procedures, and orders are all accurate and complete. ? ?Time Spent: ?45 minutes of total time was spent on the date of the encounter performing the following actions: chart review prior to seeing the patient including recent visits with specialists, obtaining history, performing a medically necessary exam, counseling on the treatment plan, placing orders, and documenting in our EHR.  ? ? ?[name]. [name], MD ?09/21/2021 9:26 AM  ? ?

## 2021-09-21 NOTE — Assessment & Plan Note (Signed)
On pravastatin 20 mg daily.  We will check labs today. ?

## 2021-09-21 NOTE — Assessment & Plan Note (Signed)
At goal today on losartan 50 mg daily. 

## 2021-09-21 NOTE — Assessment & Plan Note (Signed)
Check TSH T4. ?

## 2021-09-21 NOTE — Assessment & Plan Note (Signed)
Follows with sports medicine.  Takes meloxicam 7.5 mg daily as needed. ?

## 2021-09-22 NOTE — Progress Notes (Signed)
Please inform patient of the following: ? ?Labs are all stable. Do not need to make any changes to his treatment plan at this time. We can recheck in 6-12 months.  ? ?[name]. [name], MD ?09/22/2021 8:02 AM  ?

## 2021-09-23 ENCOUNTER — Ambulatory Visit: Payer: Medicare HMO | Admitting: Family Medicine

## 2021-10-05 ENCOUNTER — Other Ambulatory Visit: Payer: Self-pay

## 2021-10-05 ENCOUNTER — Ambulatory Visit: Payer: Medicare HMO | Admitting: Family Medicine

## 2021-10-05 ENCOUNTER — Encounter: Payer: Self-pay | Admitting: Family Medicine

## 2021-10-05 ENCOUNTER — Ambulatory Visit: Payer: Self-pay

## 2021-10-05 VITALS — BP 120/70 | HR 75 | Ht 68.0 in | Wt 158.4 lb

## 2021-10-05 DIAGNOSIS — M25561 Pain in right knee: Secondary | ICD-10-CM | POA: Diagnosis not present

## 2021-10-05 DIAGNOSIS — M1711 Unilateral primary osteoarthritis, right knee: Secondary | ICD-10-CM | POA: Diagnosis not present

## 2021-10-05 DIAGNOSIS — M5412 Radiculopathy, cervical region: Secondary | ICD-10-CM

## 2021-10-05 DIAGNOSIS — G8929 Other chronic pain: Secondary | ICD-10-CM | POA: Diagnosis not present

## 2021-10-05 MED ORDER — GABAPENTIN 300 MG PO CAPS: 300.0000 mg | ORAL_CAPSULE | Freq: Three times a day (TID) | ORAL | 2 refills | Status: DC | PRN

## 2021-10-05 MED ORDER — MELOXICAM 7.5 MG PO TABS: 7.5000 mg | ORAL_TABLET | Freq: Every day | ORAL | 3 refills | Status: DC

## 2021-10-05 NOTE — Patient Instructions (Addendum)
Good to see you today. ? ?You had a R knee injection.  Call or go to the ER if you develop a large red swollen joint with extreme pain or oozing puss.  ? ?Continue your home exercises. ? ?Follow-up: as needed   ?
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
## 2021-11-17 ENCOUNTER — Other Ambulatory Visit: Payer: Self-pay | Admitting: Family Medicine

## 2021-12-24 IMAGING — DX DG CERVICAL SPINE 2 OR 3 VIEWS
3 series · 3 of 3 positions shown · non-contrast
Comparison: 09/17/2015

CLINICAL DATA: Neck pain

EXAM:
CERVICAL SPINE - 2-3 VIEW

[c-spine lat]
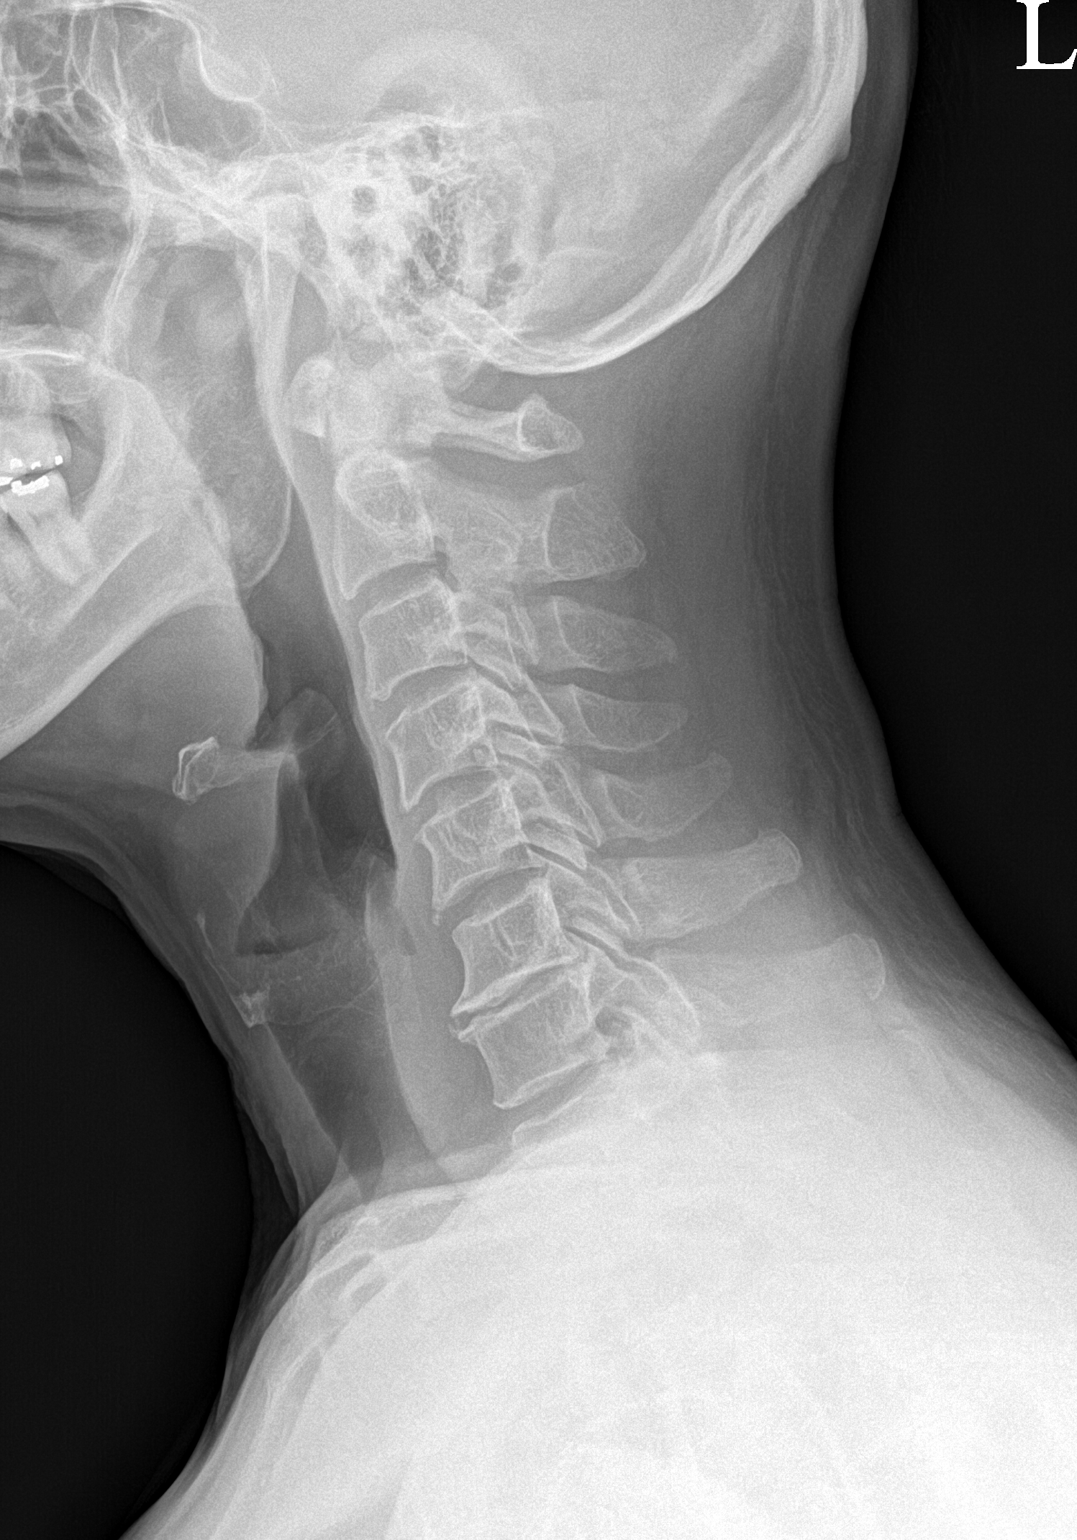

[c-spine ap]
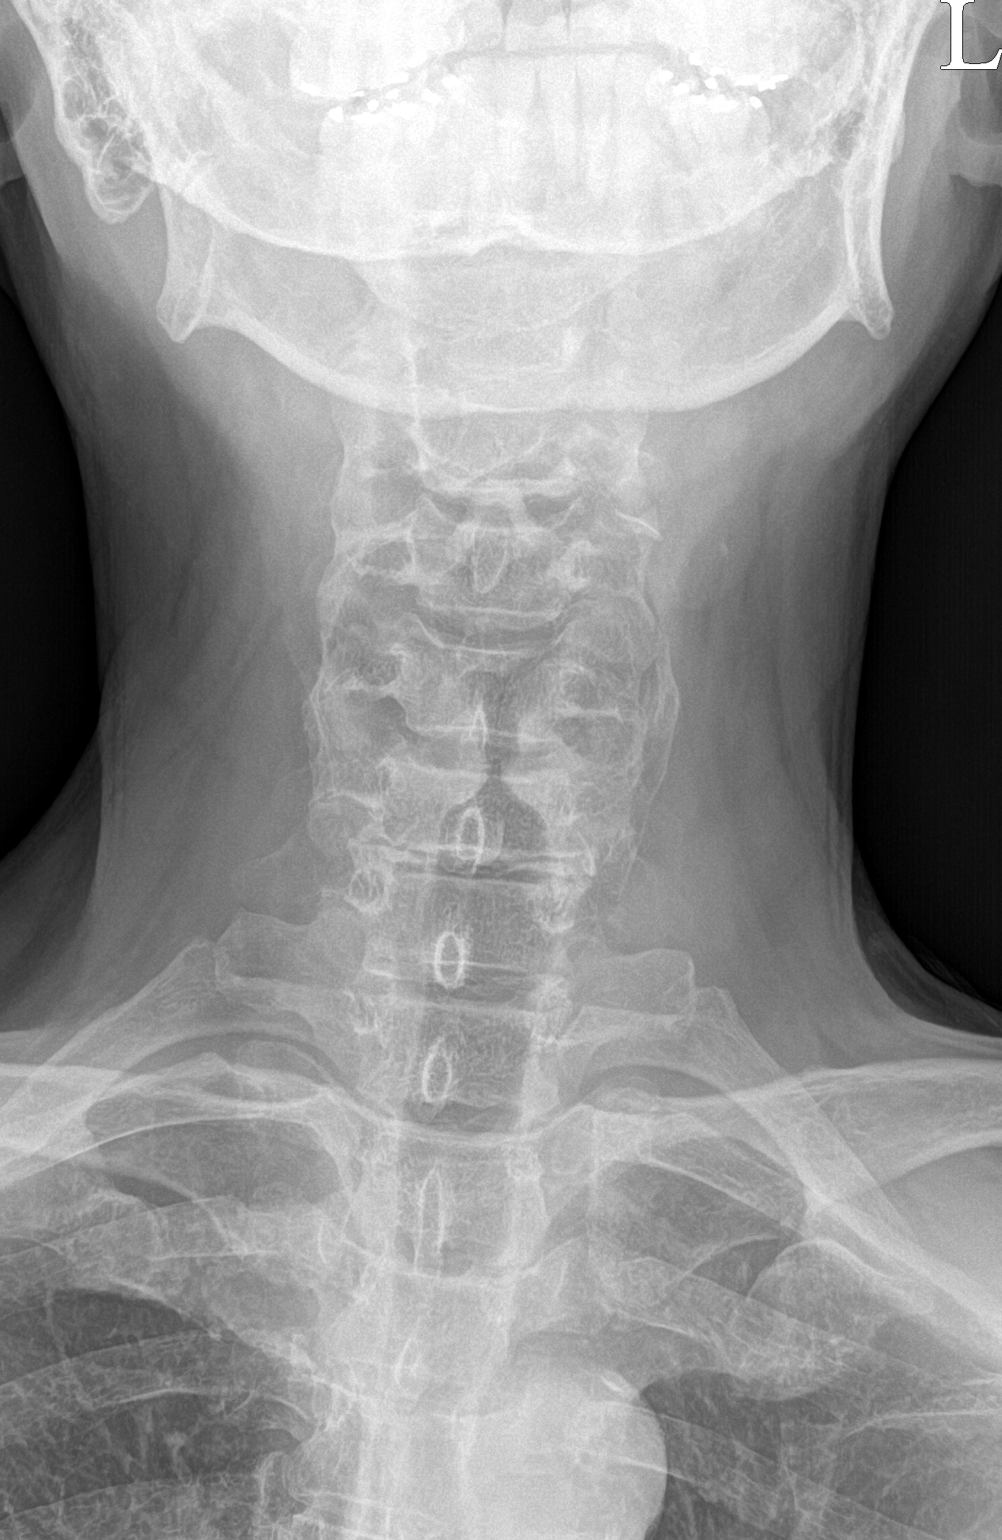

[c-spine open mouth]
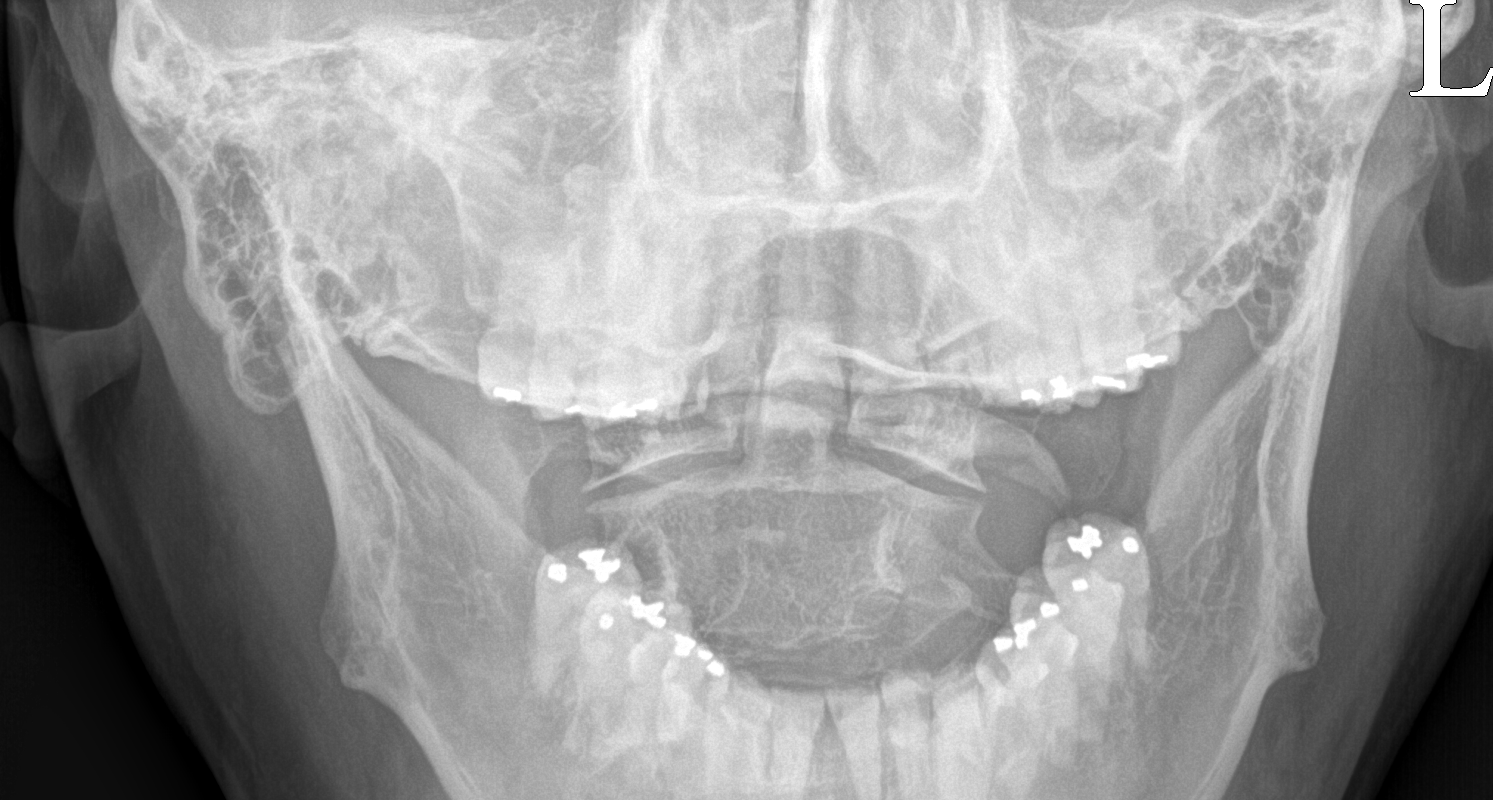

[3 of 3 positions shown; findings below may reference images not displayed]

FINDINGS: Straightening of the cervical spine. Vertebral body heights are
maintained. Multilevel degenerative change with advanced
degenerative changes at C6-C7. Dens and masses are within normal
limits
IMPRESSION: Straightening of the cervical spine with degenerative changes most
advanced at C6-C7. Findings do not appear significantly changed
compared to prior

## 2021-12-31 ENCOUNTER — Other Ambulatory Visit: Payer: Self-pay | Admitting: Family Medicine

## 2022-02-10 ENCOUNTER — Other Ambulatory Visit: Payer: Self-pay | Admitting: Family Medicine

## 2022-03-17 ENCOUNTER — Ambulatory Visit (INDEPENDENT_AMBULATORY_CARE_PROVIDER_SITE_OTHER): Payer: Medicare HMO | Admitting: Family Medicine

## 2022-03-17 ENCOUNTER — Ambulatory Visit: Payer: Medicare HMO | Admitting: Family Medicine

## 2022-03-17 ENCOUNTER — Ambulatory Visit (INDEPENDENT_AMBULATORY_CARE_PROVIDER_SITE_OTHER): Payer: Medicare HMO

## 2022-03-17 ENCOUNTER — Ambulatory Visit: Payer: Self-pay

## 2022-03-17 VITALS — BP 162/90 | HR 88 | Ht 68.0 in | Wt 157.6 lb

## 2022-03-17 DIAGNOSIS — M25561 Pain in right knee: Secondary | ICD-10-CM | POA: Diagnosis not present

## 2022-03-17 DIAGNOSIS — M1711 Unilateral primary osteoarthritis, right knee: Secondary | ICD-10-CM | POA: Diagnosis not present

## 2022-03-17 NOTE — Patient Instructions (Addendum)
Thank you for coming in today.   You received an injection today. Seek immediate medical attention if the joint becomes red, extremely painful, or is oozing fluid.   Please get an Xray today before you leave   Let me know how you are feeling in a few days.
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
## 2022-03-18 NOTE — Progress Notes (Signed)
Right knee x-ray looks about the same as it did last year.

## 2022-03-22 ENCOUNTER — Telehealth: Payer: Self-pay | Admitting: *Deleted

## 2022-03-22 NOTE — Telephone Encounter (Signed)
Pt was seen 03/17/22 & pts daughter Jaben Benegas would like a call back to discuss the OV. She stated her dad has difficulty understanding and she would like to talk with someone that can briefly go over the [place] notes with her. DPR is on file that gives permission to discuss with her. Best callback # [phone].

## 2022-03-22 NOTE — Telephone Encounter (Signed)
Spoke to pt's daughter and recalled w/ her a synopsis of the visit; injection, XR results, advised to use Voltaren gel, and quad strengthening. Advised her to call back if she needed any additional assistance.

## 2022-04-05 ENCOUNTER — Encounter: Payer: Self-pay | Admitting: Family Medicine

## 2022-04-05 ENCOUNTER — Ambulatory Visit (INDEPENDENT_AMBULATORY_CARE_PROVIDER_SITE_OTHER): Payer: Medicare HMO | Admitting: Family Medicine

## 2022-04-05 VITALS — BP 129/72 | HR 63 | Temp 97.6°F | Ht 68.0 in | Wt 153.0 lb

## 2022-04-05 DIAGNOSIS — Z23 Encounter for immunization: Secondary | ICD-10-CM

## 2022-04-05 DIAGNOSIS — R682 Dry mouth, unspecified: Secondary | ICD-10-CM

## 2022-04-05 DIAGNOSIS — E119 Type 2 diabetes mellitus without complications: Secondary | ICD-10-CM | POA: Diagnosis not present

## 2022-04-05 DIAGNOSIS — E038 Other specified hypothyroidism: Secondary | ICD-10-CM

## 2022-04-05 DIAGNOSIS — I152 Hypertension secondary to endocrine disorders: Secondary | ICD-10-CM

## 2022-04-05 DIAGNOSIS — E1159 Type 2 diabetes mellitus with other circulatory complications: Secondary | ICD-10-CM

## 2022-04-05 LAB — CBC
HCT: 39.9 % (ref 39.0–52.0)
Hemoglobin: 13.5 g/dL (ref 13.0–17.0)
MCHC: 33.7 g/dL (ref 30.0–36.0)
MCV: 92.9 fl (ref 78.0–100.0)
Platelets: 193 10*3/uL (ref 150.0–400.0)
RBC: 4.3 Mil/uL (ref 4.22–5.81)
RDW: 13.7 % (ref 11.5–15.5)
WBC: 5.5 10*3/uL (ref 4.0–10.5)

## 2022-04-05 LAB — COMPREHENSIVE METABOLIC PANEL
ALT: 14 U/L (ref 0–53)
AST: 16 U/L (ref 0–37)
Albumin: 4.2 g/dL (ref 3.5–5.2)
Alkaline Phosphatase: 50 U/L (ref 39–117)
BUN: 20 mg/dL (ref 6–23)
CO2: 31 mEq/L (ref 19–32)
Calcium: 9.6 mg/dL (ref 8.4–10.5)
Chloride: 100 mEq/L (ref 96–112)
Creatinine, Ser: 0.98 mg/dL (ref 0.40–1.50)
GFR: 79.05 mL/min (ref >60.00)
Glucose, Bld: 127 mg/dL — ABNORMAL HIGH (ref 70–99)
Potassium: 4.4 mEq/L (ref 3.5–5.1)
Sodium: 137 mEq/L (ref 135–145)
Total Bilirubin: 1.4 mg/dL — ABNORMAL HIGH (ref 0.2–1.2)
Total Protein: 7.1 g/dL (ref 6.0–8.3)

## 2022-04-05 LAB — POCT GLYCOSYLATED HEMOGLOBIN (HGB A1C): Hemoglobin A1C: 7.1 % — AB (ref 4.0–5.6)

## 2022-04-05 LAB — T4, FREE: Free T4: 0.98 ng/dL (ref 0.60–1.60)

## 2022-04-05 LAB — TSH: TSH: 4.2 u[IU]/mL (ref 0.35–5.50)

## 2022-04-05 LAB — T3, FREE: T3, Free: 2.4 pg/mL (ref 2.3–4.2)

## 2022-04-05 MED ORDER — BIOTENE DRY MOUTH MT LOZG: LOZENGE | OROMUCOSAL | 11 refills | Status: DC

## 2022-04-05 NOTE — Assessment & Plan Note (Signed)
Blood pressure is at goal on losartan 50 mg daily.  We will check labs.

## 2022-04-05 NOTE — Progress Notes (Signed)
   Richard Reeves is a 71 y.o. male who presents today for an office visit.  Assessment/Plan:  New/Acute Problems: Xerostomia  No red flags.  Likely due to underlying subclinical hypothyroidism.  We will be rechecking labs today.  He can use hard candies or Biotene lozenges as needed.  If labs are normal and symptoms persist would consider referral to ENT.  Chronic Problems Addressed Today: Type 2 diabetes mellitus without complication, without long-term current use of insulin (HCC) A1c stable at 7.1.  Continue metformin 1000 mg twice daily.  Recheck in 3 to 6 months.  Hypertension associated with diabetes ([place]) Blood pressure is at goal on losartan 50 mg daily.  We will check labs.  Subclinical hypothyroidism May be contributing to his dry mouth.  We will check TSH, T4, T3.  Flu shot given today.     Subjective:  HPI:  See a/p for status of chronic conditions.  He has been having issues with dry mouth for the last couple of months. Sometimes this interferes with his ability to talk. No recent illnesses. Tries drinking water with helps temporarily. Seems to be worse in the evening.  No other recent changes.  No recent illnesses.       Objective:  Physical Exam: BP 129/72   Pulse 63   Temp 97.6 F (36.4 C) (Temporal)   Ht '5\' 8"'$  (1.727 m)   Wt 153 lb (69.4 kg)   SpO2 99%   BMI 23.26 kg/m   Gen: No acute distress, resting comfortably CV: Regular rate and rhythm with no murmurs appreciated Pulm: Normal work of breathing, clear to auscultation bilaterally with no crackles, wheezes, or rhonchi Neuro: Grossly normal, moves all extremities Psych: Normal affect and thought content      Iran Rowe M. [name], MD 04/05/2022 9:34 AM

## 2022-04-05 NOTE — Patient Instructions (Signed)
It was very nice to see you today!  We will check blood work today to evaluate for your dry mouth.  Please try the biotene to help with your dry mouth.  No other medication changes today.  I will see you back in 3 to 6 months.  Come back sooner if needed.  Take care, Dr [name]  PLEASE NOTE:  If you had any lab tests please let us know if you have not heard back within a few days. You may see your results on mychart before we have a chance to review them but we will give you a call once they are reviewed by [place]. If we ordered any referrals today, please let us know if you have not heard from their office within the next week.   Please try these tips to maintain a healthy lifestyle:  Eat at least 3 REAL meals and 1-2 snacks per day.  Aim for no more than 5 hours between eating.  If you eat breakfast, please do so within one hour of getting up.   Each meal should contain half fruits/vegetables, one quarter protein, and one quarter carbs (no bigger than a computer mouse)  Cut down on sweet beverages. This includes juice, soda, and sweet tea.   Drink at least 1 glass of water with each meal and aim for at least 8 glasses per day  Exercise at least 150 minutes every week.

## 2022-04-05 NOTE — Assessment & Plan Note (Signed)
May be contributing to his dry mouth.  We will check TSH, T4, T3.

## 2022-04-05 NOTE — Assessment & Plan Note (Signed)
A1c stable at 7.1.  Continue metformin 1000 mg twice daily.  Recheck in 3 to 6 months.

## 2022-04-07 ENCOUNTER — Other Ambulatory Visit: Payer: Self-pay | Admitting: Family Medicine

## 2022-04-07 ENCOUNTER — Other Ambulatory Visit: Payer: Self-pay | Admitting: *Deleted

## 2022-04-07 ENCOUNTER — Telehealth: Payer: Self-pay | Admitting: Family Medicine

## 2022-04-07 MED ORDER — GABAPENTIN 300 MG PO CAPS: 300.0000 mg | ORAL_CAPSULE | Freq: Three times a day (TID) | ORAL | 2 refills | Status: DC | PRN

## 2022-04-07 MED ORDER — LOSARTAN POTASSIUM 50 MG PO TABS: 50.0000 mg | ORAL_TABLET | Freq: Every day | ORAL | 0 refills | Status: DC

## 2022-04-07 NOTE — Telephone Encounter (Signed)
..   Encourage patient to contact the pharmacy for refills or they can request refills through [facility]:  04/05/22   NEXT APPOINTMENT DATE:  MEDICATION: ALL MEDICATIONS / Gabapentin/ losartan. Wants a refill on all his medications   Is the patient out of medication?  Yes   PHARMACY: [facility]  on file   Let patient know to contact pharmacy at the end of the day to make sure medication is ready.  Please notify patient to allow 48-72 hours to process

## 2022-04-07 NOTE — Telephone Encounter (Signed)
Rx send to [facility]

## 2022-04-07 NOTE — Progress Notes (Signed)
Please inform patient of the following:  Labs are all stable. No clear reason for his dry mouth.  Recommend referral to ENT as we discussed at his office visit.

## 2022-04-08 ENCOUNTER — Other Ambulatory Visit: Payer: Self-pay | Admitting: Family Medicine

## 2022-04-11 ENCOUNTER — Other Ambulatory Visit: Payer: Self-pay

## 2022-04-11 DIAGNOSIS — R682 Dry mouth, unspecified: Secondary | ICD-10-CM

## 2022-05-09 ENCOUNTER — Ambulatory Visit (INDEPENDENT_AMBULATORY_CARE_PROVIDER_SITE_OTHER): Payer: Medicare HMO

## 2022-05-09 VITALS — BP 120/64 | HR 75 | Temp 98.0°F | Wt 155.8 lb

## 2022-05-09 DIAGNOSIS — Z0000 Encounter for general adult medical examination without abnormal findings: Secondary | ICD-10-CM | POA: Diagnosis not present

## 2022-05-09 NOTE — Progress Notes (Signed)
Subjective:   Richard Reeves is a 71 y.o. male who presents for Medicare Annual/Subsequent preventive examination.  Review of Systems     Cardiac Risk Factors include: advanced age (>74mn, >>33women);hypertension;diabetes mellitus;dyslipidemia;male gender     Objective:    Today's Vitals   05/09/22 1424  BP: 120/64  Pulse: 75  Temp: 98 F (36.7 C)  SpO2: 96%  Weight: 155 lb 12.8 oz (70.7 kg)   Body mass index is 23.69 kg/m.     05/09/2022    2:36 PM 11/30/2020    3:09 PM 01/01/2016    2:55 AM  Advanced Directives  Does Patient Have a Medical Advance Directive? No No No  Would patient like information on creating a medical advance directive? No - Patient declined Yes (MAU/Ambulatory/Procedural Areas - Information given)     Current Medications (verified) Outpatient Encounter Medications as of 05/09/2022  Medication Sig   Artificial Saliva (BIOTENE DRY MOUTH) LOZG Use as needed for mouth   aspirin EC 81 MG tablet Take 81 mg by mouth daily.   blood glucose meter kit and supplies KIT Dispense based on patient and insurance preference. Use daily as needed to check blood sugar.   gabapentin (NEURONTIN) 300 MG capsule Take 1 capsule (300 mg total) by mouth 3 (three) times daily as needed.   losartan (COZAAR) 50 MG tablet Take 1 tablet (50 mg total) by mouth daily.   meloxicam (MOBIC) 7.5 MG tablet Take 1 tablet (7.5 mg total) by mouth daily.   metFORMIN (GLUCOPHAGE) 1000 MG tablet TAKE 1 TABLET BY MOUTH TWICE DAILY WITH A MEAL   mometasone (NASONEX) 50 MCG/ACT nasal spray Place 2 sprays into the nose daily.   pravastatin (PRAVACHOL) 20 MG tablet Take 1 tablet by mouth once daily   No facility-administered encounter medications on file as of 05/09/2022.    Allergies (verified) Patient has no known allergies.   History: Past Medical History:  Diagnosis Date   Diabetes mellitus without complication ([place]    Hyperlipidemia    History reviewed. No pertinent surgical  history. Family History  Problem Relation Age of Onset   Cancer Mother    Colon cancer Neg Hx    Esophageal cancer Neg Hx    Inflammatory bowel disease Neg Hx    Liver disease Neg Hx    Pancreatic cancer Neg Hx    Rectal cancer Neg Hx    Stomach cancer Neg Hx    Social History   Socioeconomic History   Marital status: Married    Spouse name: Not on file   Number of children: Not on file   Years of education: Not on file   Highest education level: Not on file  Occupational History   Not on file  Tobacco Use   Smoking status: Former   Smokeless tobacco: Never  Vaping Use   Vaping Use: Never used  Substance and Sexual Activity   Alcohol use: No    Alcohol/week: 0.0 standard drinks of alcohol   Drug use: No   Sexual activity: Yes    Partners: Female  Other Topics Concern   Not on file  Social History Narrative   Not on file   Social Determinants of Health   Financial Resource Strain: Low Risk  (05/09/2022)   Overall Financial Resource Strain (CARDIA)    Difficulty of Paying Living Expenses: Not hard at all  Food Insecurity: No Food Insecurity (05/09/2022)   Hunger Vital Sign    Worried About Running Out of Food  in the Last Year: Never true    [place] in the Last Year: Never true  Transportation Needs: No Transportation Needs (05/09/2022)   PRAPARE - Hydrologist (Medical): No    Lack of Transportation (Non-Medical): No  Physical Activity: Inactive (05/09/2022)   Exercise Vital Sign    Days of Exercise per Week: 0 days    Minutes of Exercise per Session: 0 min  Stress: No Stress Concern Present (05/09/2022)   [place]    Feeling of Stress : Not at all  Social Connections: Moderately Integrated (05/09/2022)   Social Connection and Isolation Panel [NHANES]    Frequency of Communication with Friends and Family: More than three times a week    Frequency of Social  Gatherings with Friends and Family: More than three times a week    Attends Religious Services: More than 4 times per year    Active Member of [employer] or Organizations: No    Attends Music therapist: Never    Marital Status: Married    Tobacco Counseling Counseling given: Not Answered   Clinical Intake:  Pre-visit preparation completed: Yes  Pain : No/denies pain     BMI - recorded: 23.69 Nutritional Status: BMI of 19-24  Normal Nutritional Risks: None Diabetes: Yes CBG done?: No Did pt. bring in CBG monitor from home?: No  How often do you need to have someone help you when you read instructions, pamphlets, or other written materials from your doctor or pharmacy?: 1 - Never  Diabetic?Nutrition Risk Assessment:  Has the patient had any N/V/D within the last 2 months?  No  Does the patient have any non-healing wounds?  No  Has the patient had any unintentional weight loss or weight gain?  No   Diabetes:  Is the patient diabetic?  Yes  If diabetic, was a CBG obtained today?  No  Did the patient bring in their glucometer from home?  No  How often do you monitor your CBG's? As needed .   Financial Strains and Diabetes Management:  Are you having any financial strains with the device, your supplies or your medication? No .  Does the patient want to be seen by Chronic Care Management for management of their diabetes?  No  Would the patient like to be referred to a Nutritionist or for Diabetic Management?  No   Diabetic Exams:  Diabetic Eye Exam: Overdue for diabetic eye exam. Pt has been advised about the importance in completing this exam. Patient advised to call and schedule an eye exam. Diabetic Foot Exam: Completed 09/21/21   Interpreter Needed?: Yes Interpreter Agency: community access partners Interpreter Name: Eduard Roux Patient Declined Interpreter : No Patient signed [place] waiver: No (outside source)  Information entered by :: Charlott Rakes, LPN   Activities of Daily Living    05/09/2022    2:38 PM  In your present state of health, do you have any difficulty performing the following activities:  Hearing? 0  Vision? 0  Difficulty concentrating or making decisions? 0  Walking or climbing stairs? 0  Dressing or bathing? 0  Doing errands, shopping? 0  Preparing Food and eating ? N  Using the Toilet? N  In the past six months, have you accidently leaked urine? N  Do you have problems with loss of bowel control? N  Managing your Medications? N  Managing your Finances? N  Housekeeping or  managing your Housekeeping? N    Patient Care Team: Vivi Barrack, MD as PCP - General (Family Medicine)  Indicate any recent Medical Services you may have received from other than [facility] providers in the past year (date may be approximate).     Assessment:   This is a routine wellness examination for Jayvon.  Hearing/Vision screen Hearing Screening - Comments:: Pt denies any hearing issues  Vision Screening - Comments:: Pt follows up with Dr [facility] for annul eye exams   Dietary issues and exercise activities discussed: Current Exercise Habits: The patient has a physically strenuous job, but has no regular exercise apart from work.   Goals Addressed             This Visit's Progress    Patient Stated       None at this time        Depression Screen    05/09/2022    2:35 PM 04/05/2022    9:07 AM 03/16/2021    8:50 AM 02/23/2021    9:25 AM 11/30/2020    3:08 PM 10/13/2020    9:24 AM 05/06/2019    1:53 PM  PHQ 2/9 Scores  PHQ - 2 Score 0 0 0 0 0 0 0    Fall Risk    05/09/2022    2:38 PM 04/05/2022    9:08 AM 04/09/2021    9:32 AM 11/30/2020    3:12 PM 10/13/2020    9:25 AM  Fall Risk   Falls in the past year? 0 0 0 0 0  Number falls in past yr: 0 0 0 0   Injury with Fall? 0 0 0 0   Risk for fall due to :  No Fall Risks  Impaired vision   Follow up Falls prevention discussed   Falls prevention  discussed     FALL RISK PREVENTION PERTAINING TO THE HOME:  Any stairs in or around the home? Yes  If so, are there any without handrails? No  Home free of loose throw rugs in walkways, pet beds, electrical cords, etc? Yes  Adequate lighting in your home to reduce risk of falls? Yes   ASSISTIVE DEVICES UTILIZED TO PREVENT FALLS:  Life alert? No  Use of a cane, walker or w/c? No  Grab bars in the bathroom? No  Shower chair or bench in shower? No  Elevated toilet seat or a handicapped toilet? No   TIMED UP AND GO:  Was the test performed? Yes .  Length of time to ambulate 10 feet: 10 sec.   Gait steady and fast without use of assistive device  Cognitive Function:        05/09/2022    2:39 PM 11/30/2020    3:18 PM  6CIT Screen  What Year? 0 points 0 points  What month? 0 points 0 points  What time? 0 points   Count back from 20 0 points 0 points  Months in reverse 2 points 0 points  Repeat phrase 0 points 0 points  Total Score 2 points     Immunizations Immunization History  Administered Date(s) Administered   Fluad Quad(high Dose 65+) 03/30/2019, 04/16/2020, 04/05/2022   Hepb-cpg 12/11/2018, 01/22/2019   Influenza,inj,Quad PF,6+ Mos 07/24/2018   Influenza-Unspecified 07/16/2021   PFIZER(Purple Top)SARS-COV-2 Vaccination 09/08/2019, 10/02/2019, 05/25/2020   Pfizer Covid-19 Vaccine Bivalent Booster 5y-11y 06/28/2021   Pneumococcal Conjugate-13 07/24/2018, 03/30/2019   Pneumococcal Polysaccharide-23 10/13/2020   Tdap 01/01/2016   Zoster Recombinat (Shingrix) 04/19/2019, 06/19/2019  TDAP status: Up to date  Flu Vaccine status: Up to date  Pneumococcal vaccine status: Up to date  Covid-19 vaccine status: Completed vaccines  Qualifies for Shingles Vaccine? Yes   Zostavax completed Yes   Shingrix Completed?: Yes  Screening Tests Health Maintenance  Topic Date Due   OPHTHALMOLOGY EXAM  Never done   Diabetic kidney evaluation - Urine ACR  10/13/2021    COVID-19 Vaccine (5 - Pfizer series) 10/27/2021   FOOT EXAM  09/22/2022   HEMOGLOBIN A1C  10/04/2022   Diabetic kidney evaluation - GFR measurement  04/06/2023   COLONOSCOPY (Pts 45-56yr Insurance coverage will need to be confirmed)  10/31/2025   TETANUS/TDAP  12/31/2025   Pneumonia Vaccine 69 Years old  Completed   INFLUENZA VACCINE  Completed   Hepatitis C Screening  Completed   Zoster Vaccines- Shingrix  Completed   HPV VACCINES  Aged Out    Health Maintenance  Health Maintenance Due  Topic Date Due   OPHTHALMOLOGY EXAM  Never done   Diabetic kidney evaluation - Urine ACR  10/13/2021   COVID-19 Vaccine (5 - Pfizer series) 10/27/2021    Colorectal cancer screening: Type of screening: Colonoscopy. Completed 11/01/18. Repeat every 7 years  Additional Screening:  Hepatitis C Screening:  Completed 07/24/18  Vision Screening: Recommended annual ophthalmology exams for early detection of glaucoma and other disorders of the eye. Is the patient up to date with their annual eye exam?  Yes  Who is the provider or what is the name of the office in which the patient attends annual eye exams? [facility] If pt is not established with a provider, would they like to be referred to a provider to establish care? No .   Dental Screening: Recommended annual dental exams for proper oral hygiene  Community Resource Referral / Chronic Care Management: CRR required this visit?  No   CCM required this visit?  No      Plan:     I have personally reviewed and noted the following in the patient's chart:   Medical and social history Use of alcohol, tobacco or illicit drugs  Current medications and supplements including opioid prescriptions. Patient is not currently taking opioid prescriptions. Functional ability and status Nutritional status Physical activity Advanced directives List of other physicians Hospitalizations, surgeries, and ER visits in previous 12 months Vitals Screenings  to include cognitive, depression, and falls Referrals and appointments  In addition, I have reviewed and discussed with patient certain preventive protocols, quality metrics, and best practice recommendations. A written personalized care plan for preventive services as well as general preventive health recommendations were provided to patient.     TWillette Brace LPN   101/75/1025  Nurse Notes: none

## 2022-05-09 NOTE — Patient Instructions (Signed)
Richard Reeves , Thank you for taking time to come for your Medicare Wellness Visit. I appreciate your ongoing commitment to your health goals. Please review the following plan we discussed and let me know if I can assist you in the future.   These are the goals we discussed:  Goals      Patient Stated     None at this time        This is a list of the screening recommended for you and due dates:  Health Maintenance  Topic Date Due   Eye exam for diabetics  Never done   Yearly kidney health urinalysis for diabetes  10/13/2021   COVID-19 Vaccine (5 - Pfizer series) 10/27/2021   Complete foot exam   09/22/2022   Hemoglobin A1C  10/04/2022   Yearly kidney function blood test for diabetes  04/06/2023   Colon Cancer Screening  10/31/2025   Tetanus Vaccine  12/31/2025   Pneumonia Vaccine  Completed   Flu Shot  Completed   Hepatitis C Screening: USPSTF Recommendation to screen - Ages 18-79 yo.  Completed   Zoster (Shingles) Vaccine  Completed   HPV Vaccine  Aged Out    Advanced directives: Please bring a copy of your health care power of attorney and living will to the office at your convenience.  Conditions/risks identified: none at this time   Next appointment: Follow up in one year for your annual wellness visit.   Preventive Care 69 Years and Older, Male  Preventive care refers to lifestyle choices and visits with your health care provider that can promote health and wellness. What does preventive care include? A yearly physical exam. This is also called an annual well check. Dental exams once or twice a year. Routine eye exams. Ask your health care provider how often you should have your eyes checked. Personal lifestyle choices, including: Daily care of your teeth and gums. Regular physical activity. Eating a healthy diet. Avoiding tobacco and drug use. Limiting alcohol use. Practicing safe sex. Taking low doses of aspirin every day. Taking vitamin and mineral supplements as  recommended by your health care provider. What happens during an annual well check? The services and screenings done by your health care provider during your annual well check will depend on your age, overall health, lifestyle risk factors, and family history of disease. Counseling  Your health care provider may ask you questions about your: Alcohol use. Tobacco use. Drug use. Emotional well-being. Home and relationship well-being. Sexual activity. Eating habits. History of falls. Memory and ability to understand (cognition). Work and work Statistician. Screening  You may have the following tests or measurements: Height, weight, and BMI. Blood pressure. Lipid and cholesterol levels. These may be checked every 5 years, or more frequently if you are over 35 years old. Skin check. Lung cancer screening. You may have this screening every year starting at age 32 if you have a 30-pack-year history of smoking and currently smoke or have quit within the past 15 years. Fecal occult blood test (FOBT) of the stool. You may have this test every year starting at age 77. Flexible sigmoidoscopy or colonoscopy. You may have a sigmoidoscopy every 5 years or a colonoscopy every 10 years starting at age 58. Prostate cancer screening. Recommendations will vary depending on your family history and other risks. Hepatitis C blood test. Hepatitis B blood test. Sexually transmitted disease (STD) testing. Diabetes screening. This is done by checking your blood sugar (glucose) after you have not eaten  for a while (fasting). You may have this done every 1-3 years. Abdominal aortic aneurysm (AAA) screening. You may need this if you are a current or former smoker. Osteoporosis. You may be screened starting at age 22 if you are at high risk. Talk with your health care provider about your test results, treatment options, and if necessary, the need for more tests. Vaccines  Your health care provider may recommend  certain vaccines, such as: Influenza vaccine. This is recommended every year. Tetanus, diphtheria, and acellular pertussis (Tdap, Td) vaccine. You may need a Td booster every 10 years. Zoster vaccine. You may need this after age 110. Pneumococcal 13-valent conjugate (PCV13) vaccine. One dose is recommended after age 18. Pneumococcal polysaccharide (PPSV23) vaccine. One dose is recommended after age 29. Talk to your health care provider about which screenings and vaccines you need and how often you need them. This information is not intended to replace advice given to you by your health care provider. Make sure you discuss any questions you have with your health care provider. Document Released: 07/31/2015 Document Revised: 03/23/2016 Document Reviewed: 05/05/2015 Elsevier Interactive Patient Education  2017 [employer] Prevention in the Home Falls can cause injuries. They can happen to people of all ages. There are many things you can do to make your home safe and to help prevent falls. What can I do on the outside of my home? Regularly fix the edges of walkways and driveways and fix any cracks. Remove anything that might make you trip as you walk through a door, such as a raised step or threshold. Trim any bushes or trees on the path to your home. Use bright outdoor lighting. Clear any walking paths of anything that might make someone trip, such as rocks or tools. Regularly check to see if handrails are loose or broken. Make sure that both sides of any steps have handrails. Any raised decks and porches should have guardrails on the edges. Have any leaves, snow, or ice cleared regularly. Use sand or salt on walking paths during winter. Clean up any spills in your garage right away. This includes oil or grease spills. What can I do in the bathroom? Use night lights. Install grab bars by the toilet and in the tub and shower. Do not use towel bars as grab bars. Use non-skid mats or  decals in the tub or shower. If you need to sit down in the shower, use a plastic, non-slip stool. Keep the floor dry. Clean up any water that spills on the floor as soon as it happens. Remove soap buildup in the tub or shower regularly. Attach bath mats securely with double-sided non-slip rug tape. Do not have throw rugs and other things on the floor that can make you trip. What can I do in the bedroom? Use night lights. Make sure that you have a light by your bed that is easy to reach. Do not use any sheets or blankets that are too big for your bed. They should not hang down onto the floor. Have a firm chair that has side arms. You can use this for support while you get dressed. Do not have throw rugs and other things on the floor that can make you trip. What can I do in the kitchen? Clean up any spills right away. Avoid walking on wet floors. Keep items that you use a lot in easy-to-reach places. If you need to reach something above you, use a strong step stool that has  a grab bar. Keep electrical cords out of the way. Do not use floor polish or wax that makes floors slippery. If you must use wax, use non-skid floor wax. Do not have throw rugs and other things on the floor that can make you trip. What can I do with my stairs? Do not leave any items on the stairs. Make sure that there are handrails on both sides of the stairs and use them. Fix handrails that are broken or loose. Make sure that handrails are as long as the stairways. Check any carpeting to make sure that it is firmly attached to the stairs. Fix any carpet that is loose or worn. Avoid having throw rugs at the top or bottom of the stairs. If you do have throw rugs, attach them to the floor with carpet tape. Make sure that you have a light switch at the top of the stairs and the bottom of the stairs. If you do not have them, ask someone to add them for you. What else can I do to help prevent falls? Wear shoes that: Do not  have high heels. Have rubber bottoms. Are comfortable and fit you well. Are closed at the toe. Do not wear sandals. If you use a stepladder: Make sure that it is fully opened. Do not climb a closed stepladder. Make sure that both sides of the stepladder are locked into place. Ask someone to hold it for you, if possible. Clearly mark and make sure that you can see: Any grab bars or handrails. First and last steps. Where the edge of each step is. Use tools that help you move around (mobility aids) if they are needed. These include: Canes. Walkers. Scooters. Crutches. Turn on the lights when you go into a dark area. Replace any light bulbs as soon as they burn out. Set up your furniture so you have a clear path. Avoid moving your furniture around. If any of your floors are uneven, fix them. If there are any pets around you, be aware of where they are. Review your medicines with your doctor. Some medicines can make you feel dizzy. This can increase your chance of falling. Ask your doctor what other things that you can do to help prevent falls. This information is not intended to replace advice given to you by your health care provider. Make sure you discuss any questions you have with your health care provider. Document Released: 04/30/2009 Document Revised: 12/10/2015 Document Reviewed: 08/08/2014 Elsevier Interactive Patient Education  2017 [employer].

## 2022-05-16 DIAGNOSIS — H2513 Age-related nuclear cataract, bilateral: Secondary | ICD-10-CM | POA: Diagnosis not present

## 2022-05-16 DIAGNOSIS — H04123 Dry eye syndrome of bilateral lacrimal glands: Secondary | ICD-10-CM | POA: Diagnosis not present

## 2022-05-16 DIAGNOSIS — Z7984 Long term (current) use of oral hypoglycemic drugs: Secondary | ICD-10-CM | POA: Diagnosis not present

## 2022-05-16 DIAGNOSIS — H0100A Unspecified blepharitis right eye, upper and lower eyelids: Secondary | ICD-10-CM | POA: Diagnosis not present

## 2022-05-16 DIAGNOSIS — E113293 Type 2 diabetes mellitus with mild nonproliferative diabetic retinopathy without macular edema, bilateral: Secondary | ICD-10-CM | POA: Diagnosis not present

## 2022-05-16 DIAGNOSIS — H40003 Preglaucoma, unspecified, bilateral: Secondary | ICD-10-CM | POA: Diagnosis not present

## 2022-05-16 DIAGNOSIS — R6889 Other general symptoms and signs: Secondary | ICD-10-CM | POA: Diagnosis not present

## 2022-05-16 DIAGNOSIS — H01009 Unspecified blepharitis unspecified eye, unspecified eyelid: Secondary | ICD-10-CM | POA: Diagnosis not present

## 2022-05-16 DIAGNOSIS — E1136 Type 2 diabetes mellitus with diabetic cataract: Secondary | ICD-10-CM | POA: Diagnosis not present

## 2022-05-16 DIAGNOSIS — H0100B Unspecified blepharitis left eye, upper and lower eyelids: Secondary | ICD-10-CM | POA: Diagnosis not present

## 2022-05-31 ENCOUNTER — Encounter: Payer: Self-pay | Admitting: Family Medicine

## 2022-05-31 ENCOUNTER — Ambulatory Visit (INDEPENDENT_AMBULATORY_CARE_PROVIDER_SITE_OTHER): Payer: Medicare HMO | Admitting: Family Medicine

## 2022-05-31 VITALS — BP 123/71 | HR 62 | Temp 98.0°F | Ht 68.0 in | Wt 155.2 lb

## 2022-05-31 DIAGNOSIS — E038 Other specified hypothyroidism: Secondary | ICD-10-CM | POA: Diagnosis not present

## 2022-05-31 DIAGNOSIS — R109 Unspecified abdominal pain: Secondary | ICD-10-CM | POA: Diagnosis not present

## 2022-05-31 DIAGNOSIS — R079 Chest pain, unspecified: Secondary | ICD-10-CM

## 2022-05-31 DIAGNOSIS — I152 Hypertension secondary to endocrine disorders: Secondary | ICD-10-CM | POA: Diagnosis not present

## 2022-05-31 DIAGNOSIS — K2950 Unspecified chronic gastritis without bleeding: Secondary | ICD-10-CM | POA: Diagnosis not present

## 2022-05-31 DIAGNOSIS — E119 Type 2 diabetes mellitus without complications: Secondary | ICD-10-CM | POA: Diagnosis not present

## 2022-05-31 DIAGNOSIS — E1159 Type 2 diabetes mellitus with other circulatory complications: Secondary | ICD-10-CM | POA: Diagnosis not present

## 2022-05-31 LAB — URINALYSIS, ROUTINE W REFLEX MICROSCOPIC
Bilirubin Urine: NEGATIVE
Ketones, ur: NEGATIVE
Leukocytes,Ua: NEGATIVE
Nitrite: NEGATIVE
Specific Gravity, Urine: 1.025 (ref 1.000–1.030)
Total Protein, Urine: NEGATIVE
Urine Glucose: NEGATIVE
Urobilinogen, UA: 0.2 (ref 0.0–1.0)
WBC, UA: NONE SEEN (ref 0–?)
pH: 5.5 (ref 5.0–8.0)

## 2022-05-31 MED ORDER — PANTOPRAZOLE SODIUM 40 MG PO TBEC: 40.0000 mg | DELAYED_RELEASE_TABLET | Freq: Every day | ORAL | 3 refills | Status: DC

## 2022-05-31 NOTE — Assessment & Plan Note (Signed)
Last A1c 7.1.  Continue metformin 1000 mg twice daily.  He will come back in a few months and we can recheck A1c at that time.

## 2022-05-31 NOTE — Assessment & Plan Note (Signed)
Last TSH at goal.

## 2022-05-31 NOTE — Assessment & Plan Note (Signed)
No longer on PPI.  Will restart Protonix today.  He will let me know if symptoms or not improving as above and we will refer to GI.

## 2022-05-31 NOTE — Patient Instructions (Addendum)
It was very nice to see you today!  Please start the protonix. Let me know how you are doing   We will check a urine test today to make sure your kidneys are ok.  We will check a cardiac CT to check for blockages.  Come back in 3 months. Come back sooner if needed.   Take care, Dr [name]  PLEASE NOTE:  If you had any lab tests please let us know if you have not heard back within a few days. You may see your results on mychart before we have a chance to review them but we will give you a call once they are reviewed by [place]. If we ordered any referrals today, please let us know if you have not heard from their office within the next week.   Please try these tips to maintain a healthy lifestyle:  Eat at least 3 REAL meals and 1-2 snacks per day.  Aim for no more than 5 hours between eating.  If you eat breakfast, please do so within one hour of getting up.   Each meal should contain half fruits/vegetables, one quarter protein, and one quarter carbs (no bigger than a computer mouse)  Cut down on sweet beverages. This includes juice, soda, and sweet tea.   Drink at least 1 glass of water with each meal and aim for at least 8 glasses per day  Exercise at least 150 minutes every week.

## 2022-05-31 NOTE — Progress Notes (Signed)
Please inform patient of the following:  His urine sample shows a small amount of hemoglobin but they did not see any red blood cells under the microscope.  Can we have him come back to recheck a send out urinalysis?  Please place future order.

## 2022-05-31 NOTE — Assessment & Plan Note (Signed)
At goal today on losartan 50 mg daily.

## 2022-05-31 NOTE — Progress Notes (Signed)
   Richard Reeves is a 71 y.o. male who presents today for an office visit.  History provided by patient via [place] interpretor.   Assessment/Plan:  New/Acute Problems: Chest Pain History atypical for cardiac etiology.  EKG is reassuring without any ischemic changes.  Likely related to gastritis and GERD.  We will be treating as below.  He does have several risk factors for coronary artery disease including dyslipidemia, diabetes, and hypertension.  We will check cardiac CT scan to further evaluate for CAD.  May need referral to cardiology depending on results.  We discussed reasons to return to care and seek emergent care.  Epigastric abdominal pain Likely secondary to gastritis.  Does have a known history of this.  He is worried mostly about kidney disease.  Did have a metabolic panel done a couple months ago which was normal.  We will check UA today per patient request.  He has been on meloxicam per the direction of sports medicine recently which may have flared up some gastritis.  We will start PPI Protonix 40 mg daily for the next several weeks.  He will let me know if symptoms are not improving and we can refer to GI at that point.  Chronic Problems Addressed Today: Gastritis No longer on PPI.  Will restart Protonix today.  He will let me know if symptoms or not improving as above and we will refer to GI.  Hypertension associated with diabetes ([place]) At goal today on losartan 50 mg daily.  Subclinical hypothyroidism Last TSH at goal.  Type 2 diabetes mellitus without complication, without long-term current use of insulin (HCC) Last A1c 7.1.  Continue metformin 1000 mg twice daily.  He will come back in a few months and we can recheck A1c at that time.     Subjective:  HPI:  Patient here with chest pain.  This has been going on intermittently for years.  Happens randomly. Sometimes gets gets headache. Symptoms are not exertional. This happens a couple of times per year.  No shortness of  breath.  No nausea or vomiting.  No diaphoresis.  He has also had some left sided abdominal pain. This has been persistent for the last few days. No nausea or vomiting. No obvious aggravating or alleviating factors.        Objective:  Physical Exam: BP 123/71   Pulse 62   Temp 98 F (36.7 C) (Temporal)   Ht '5\' 8"'$  (1.727 m)   Wt 155 lb 3.2 oz (70.4 kg)   SpO2 98%   BMI 23.60 kg/m   Gen: No acute distress, resting comfortably CV: Regular rate and rhythm with no murmurs appreciated Pulm: Normal work of breathing, clear to auscultation bilaterally with no crackles, wheezes, or rhonchi Neuro: Grossly normal, moves all extremities Psych: Normal affect and thought content  EKG Sinus bradycardia.  Ventricular rate 59.  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  No ischemic changes.  Time Spent: 45 minutes of total time was spent on the date of the encounter performing the following actions: chart review prior to seeing the patient, obtaining history, performing a medically necessary exam, counseling on the treatment plan, placing orders, and documenting in our EHR.        [name]. [name], MD 05/31/2022 8:54 AM

## 2022-06-01 ENCOUNTER — Other Ambulatory Visit: Payer: Self-pay | Admitting: *Deleted

## 2022-06-01 DIAGNOSIS — R899 Unspecified abnormal finding in specimens from other organs, systems and tissues: Secondary | ICD-10-CM

## 2022-06-02 ENCOUNTER — Other Ambulatory Visit: Payer: Medicare HMO

## 2022-06-03 ENCOUNTER — Other Ambulatory Visit (INDEPENDENT_AMBULATORY_CARE_PROVIDER_SITE_OTHER): Payer: Medicare HMO

## 2022-06-03 DIAGNOSIS — R899 Unspecified abnormal finding in specimens from other organs, systems and tissues: Secondary | ICD-10-CM

## 2022-06-06 LAB — URINALYSIS, ROUTINE W REFLEX MICROSCOPIC
Bilirubin Urine: NEGATIVE
Ketones, ur: NEGATIVE
Leukocytes,Ua: NEGATIVE
Nitrite: NEGATIVE
Specific Gravity, Urine: 1.015 (ref 1.000–1.030)
Total Protein, Urine: NEGATIVE
Urine Glucose: NEGATIVE
Urobilinogen, UA: 0.2 (ref 0.0–1.0)
pH: 5.5 (ref 5.0–8.0)

## 2022-06-06 NOTE — Addendum Note (Signed)
Addended by: Loura Back on: 06/06/2022 01:56 PM   Modules accepted: Orders

## 2022-06-08 ENCOUNTER — Telehealth: Payer: Self-pay

## 2022-06-08 NOTE — Progress Notes (Signed)
Please inform patient of the following:  His urinalysis shows trace blood in his urine.  Recommend referral to urology for further management.

## 2022-06-08 NOTE — Telephone Encounter (Signed)
Called pt in regards to labs but pt was unavailable I did not leave a msg as I noticed pt needs an interpreter.

## 2022-06-16 ENCOUNTER — Other Ambulatory Visit: Payer: Self-pay | Admitting: *Deleted

## 2022-06-16 DIAGNOSIS — R319 Hematuria, unspecified: Secondary | ICD-10-CM

## 2022-07-02 ENCOUNTER — Other Ambulatory Visit: Payer: Self-pay | Admitting: Family Medicine

## 2022-07-14 DIAGNOSIS — R3121 Asymptomatic microscopic hematuria: Secondary | ICD-10-CM | POA: Diagnosis not present

## 2022-07-14 DIAGNOSIS — N400 Enlarged prostate without lower urinary tract symptoms: Secondary | ICD-10-CM | POA: Diagnosis not present

## 2022-08-24 ENCOUNTER — Telehealth: Payer: Self-pay | Admitting: Family Medicine

## 2022-08-24 ENCOUNTER — Other Ambulatory Visit: Payer: Self-pay | Admitting: *Deleted

## 2022-08-24 MED ORDER — PRAVASTATIN SODIUM 20 MG PO TABS: 20.0000 mg | ORAL_TABLET | Freq: Every day | ORAL | 0 refills | Status: DC

## 2022-08-24 MED ORDER — LOSARTAN POTASSIUM 50 MG PO TABS: 50.0000 mg | ORAL_TABLET | Freq: Every day | ORAL | 0 refills | Status: DC

## 2022-08-24 NOTE — Telephone Encounter (Signed)
Patient has a new Preferred Pharmacy (listed below)   LAST APPOINTMENT DATE:  05/31/22  NEXT APPOINTMENT DATE: 08/30/22   MEDICATION:  losartan (COZAAR) 50 MG tablet   AND    pravastatin (PRAVACHOL) 20 MG tablet    Is the patient out of medication? Yes  PHARMACY:  [facility] # [place], [place] Phone: [phone]  Fax: [phone]      Let patient know to contact pharmacy at the end of the day to make sure medication is ready.  Please notify patient to allow 48-72 hours to process

## 2022-08-24 NOTE — Telephone Encounter (Signed)
Rx send to [facility]

## 2022-08-29 ENCOUNTER — Ambulatory Visit: Payer: Medicare HMO | Admitting: Family Medicine

## 2022-08-29 ENCOUNTER — Ambulatory Visit: Payer: Self-pay

## 2022-08-29 VITALS — BP 144/78 | HR 83 | Ht 68.0 in | Wt 162.0 lb

## 2022-08-29 DIAGNOSIS — M1711 Unilateral primary osteoarthritis, right knee: Secondary | ICD-10-CM | POA: Diagnosis not present

## 2022-08-29 DIAGNOSIS — G8929 Other chronic pain: Secondary | ICD-10-CM | POA: Diagnosis not present

## 2022-08-29 DIAGNOSIS — M25561 Pain in right knee: Secondary | ICD-10-CM

## 2022-08-29 DIAGNOSIS — M722 Plantar fascial fibromatosis: Secondary | ICD-10-CM | POA: Diagnosis not present

## 2022-08-29 NOTE — Progress Notes (Signed)
   I, [name], LAT, ATC acting as a scribe for [name], MD.  Richard Reeves is a 72 y.o. male who presents to [place] at [facility] today for f/u R knee and R heel pain. Pt was last seen by Dr. Georgina Snell on 03/17/22 for R knee pain and was given a steroid injection. Today, pt reports R knee pain has returned over the last month.    Pt noticed his R heel started hurting about 2 months ago. Pt locates pain to the plantar aspect of the R calcaneous.  Aggravates: 1st thing in the morning Treatments tried: none  Dx imaging: 01/20/21 R knee XR   Pertinent review of systems: No fevers or chills  Relevant historical information: Hypertension and diabetes   Exam:  BP (!) 144/78   Pulse 83   Ht '5\' 8"'$  (1.727 m)   Wt 162 lb (73.5 kg)   SpO2 99%   BMI 24.63 kg/m  General: Well Developed, well nourished, and in no acute distress.   MSK: Right knee mild effusion normal motion with crepitation.  Intact strength.  Right foot and ankle normal. Tender palpation plantar calcaneus.  Normal foot and ankle motion.  Intact strength.    Lab  and Radiology Results  Procedure: Real-time Ultrasound Guided Injection of right knee superior lateral patellar space Device: Philips Affiniti 50G Images permanently stored and available for review in PACS Verbal informed consent obtained.  Discussed risks and benefits of procedure. Warned about infection, bleeding, hyperglycemia damage to structures among others. Patient expresses understanding and agreement Time-out conducted.   Noted no overlying erythema, induration, or other signs of local infection.   Skin prepped in a sterile fashion.   Local anesthesia: Topical Ethyl chloride.   With sterile technique and under real time ultrasound guidance: 40 mg of Kenalog and 2 mL Marcaine injected into knee joint. Fluid seen entering the joint capsule.   Completed without difficulty   Pain immediately resolved suggesting accurate placement of  the medication.   Advised to call if fevers/chills, erythema, induration, drainage, or persistent bleeding.   Images permanently stored and available for review in the ultrasound unit.  Impression: Technically successful ultrasound guided injection.        Assessment and Plan: 72 y.o. male with right knee pain thought to be due to exacerbation of DJD.  Plan for repeat steroid injection today.  Right plantar calcaneus foot pain thought to be due to plantar fasciitis.  Plan for eccentric exercises and maximizing conservative management options including gel heel cups ice massage in the evening.  We talked about night splint.  He does not think unable to sleep at night.  Recommend against barefoot walking at home.  Recommend crocs or something similar.  Recheck in a month.   PDMP not reviewed this encounter. Orders Placed This Encounter  Procedures   [place] LIMITED JOINT SPACE STRUCTURES LOW RIGHT(NO LINKED CHARGES)    Order Specific Question:   Reason for Exam (SYMPTOM  OR DIAGNOSIS REQUIRED)    Answer:   right knee pain    Order Specific Question:   Preferred imaging location?    Answer:   [facility]   No orders of the defined types were placed in this encounter.    Discussed warning signs or symptoms. Please see discharge instructions. Patient expresses understanding.   The above documentation has been reviewed and is accurate and complete [name], M.D.

## 2022-08-29 NOTE — Patient Instructions (Addendum)
Thank you for coming in today.   Please complete the exercises that the athletic trainer went over with you:  View at [email or web address]-exercise-code.com using code: NC429BD  Try wearing come Crocs or cushion house shoe  You received an injection today. Seek immediate medical attention if the joint becomes red, extremely painful, or is oozing fluid.   Please use Voltaren gel (Generic Diclofenac Gel) up to 4x daily for pain as needed.  This is available over-the-counter as both the name brand Voltaren gel and the generic diclofenac gel.   Try wearing gel heel cups  Check back in 1 month

## 2022-08-30 ENCOUNTER — Ambulatory Visit: Payer: Medicare HMO | Admitting: Family Medicine

## 2022-09-02 ENCOUNTER — Encounter: Payer: Self-pay | Admitting: Family Medicine

## 2022-09-02 ENCOUNTER — Ambulatory Visit (INDEPENDENT_AMBULATORY_CARE_PROVIDER_SITE_OTHER): Payer: Medicare HMO | Admitting: Family Medicine

## 2022-09-02 VITALS — BP 126/73 | HR 64 | Temp 98.0°F | Ht 68.0 in | Wt 157.8 lb

## 2022-09-02 DIAGNOSIS — I152 Hypertension secondary to endocrine disorders: Secondary | ICD-10-CM | POA: Diagnosis not present

## 2022-09-02 DIAGNOSIS — E785 Hyperlipidemia, unspecified: Secondary | ICD-10-CM

## 2022-09-02 DIAGNOSIS — E1169 Type 2 diabetes mellitus with other specified complication: Secondary | ICD-10-CM

## 2022-09-02 DIAGNOSIS — R823 Hemoglobinuria: Secondary | ICD-10-CM | POA: Insufficient documentation

## 2022-09-02 DIAGNOSIS — E1159 Type 2 diabetes mellitus with other circulatory complications: Secondary | ICD-10-CM | POA: Diagnosis not present

## 2022-09-02 DIAGNOSIS — E119 Type 2 diabetes mellitus without complications: Secondary | ICD-10-CM

## 2022-09-02 LAB — POCT GLYCOSYLATED HEMOGLOBIN (HGB A1C): Hemoglobin A1C: 7.3 % — AB (ref 4.0–5.6)

## 2022-09-02 LAB — LIPID PANEL
Cholesterol: 162 mg/dL (ref 0–200)
HDL: 54.1 mg/dL (ref >39.00)
NonHDL: 108.11
Total CHOL/HDL Ratio: 3
Triglycerides: 222 mg/dL — ABNORMAL HIGH (ref 0.0–149.0)
VLDL: 44.4 mg/dL — ABNORMAL HIGH (ref 0.0–40.0)

## 2022-09-02 LAB — COMPREHENSIVE METABOLIC PANEL
ALT: 17 U/L (ref 0–53)
AST: 24 U/L (ref 0–37)
Albumin: 4.9 g/dL (ref 3.5–5.2)
Alkaline Phosphatase: 62 U/L (ref 39–117)
BUN: 17 mg/dL (ref 6–23)
CO2: 29 mEq/L (ref 19–32)
Calcium: 9.9 mg/dL (ref 8.4–10.5)
Chloride: 101 mEq/L (ref 96–112)
Creatinine, Ser: 0.98 mg/dL (ref 0.40–1.50)
GFR: 78.82 mL/min (ref >60.00)
Glucose, Bld: 123 mg/dL — ABNORMAL HIGH (ref 70–99)
Potassium: 4.5 mEq/L (ref 3.5–5.1)
Sodium: 139 mEq/L (ref 135–145)
Total Bilirubin: 1.9 mg/dL — ABNORMAL HIGH (ref 0.2–1.2)
Total Protein: 7.5 g/dL (ref 6.0–8.3)

## 2022-09-02 LAB — URINALYSIS, ROUTINE W REFLEX MICROSCOPIC
Bilirubin Urine: NEGATIVE
Ketones, ur: NEGATIVE
Leukocytes,Ua: NEGATIVE
Nitrite: NEGATIVE
Specific Gravity, Urine: 1.025 (ref 1.000–1.030)
Total Protein, Urine: NEGATIVE
Urine Glucose: 250 — AB
Urobilinogen, UA: 0.2 (ref 0.0–1.0)
pH: 5.5 (ref 5.0–8.0)

## 2022-09-02 LAB — CBC
HCT: 40.6 % (ref 39.0–52.0)
Hemoglobin: 14 g/dL (ref 13.0–17.0)
MCHC: 34.4 g/dL (ref 30.0–36.0)
MCV: 91.6 fl (ref 78.0–100.0)
Platelets: 214 10*3/uL (ref 150.0–400.0)
RBC: 4.43 Mil/uL (ref 4.22–5.81)
RDW: 13.1 % (ref 11.5–15.5)
WBC: 4.9 10*3/uL (ref 4.0–10.5)

## 2022-09-02 LAB — TSH: TSH: 4.21 u[IU]/mL (ref 0.35–5.50)

## 2022-09-02 LAB — LDL CHOLESTEROL, DIRECT: Direct LDL: 85 mg/dL

## 2022-09-02 MED ORDER — METFORMIN HCL 1000 MG PO TABS: 1000.0000 mg | ORAL_TABLET | Freq: Two times a day (BID) | ORAL | 1 refills | Status: DC

## 2022-09-02 NOTE — Assessment & Plan Note (Signed)
A1c stable 7.3.  Continue metformin 1000 mg twice daily.  Come back in 6 months for recheck.

## 2022-09-02 NOTE — Assessment & Plan Note (Signed)
Has seen urology for this.  Urine microscopy has been persistently negative.  Further recommendation no further indication for workup unless is obvious RBCs on microscopy.  Patient would like to have this checked every 6 months.  Will check today.

## 2022-09-02 NOTE — Assessment & Plan Note (Signed)
At goal on losartan 90m daily. Check labs

## 2022-09-02 NOTE — Patient Instructions (Signed)
It was very nice to see you today!  Your A1c is stable today.  Will continue your current medications.  We will check a urine sample and blood work today.  Please come back in 6 months.  Come back sooner if needed.  Take care, Dr [name]  PLEASE NOTE:  If you had any lab tests, please let us know if you have not heard back within a few days. You may see your results on mychart before we have a chance to review them but we will give you a call once they are reviewed by [place].   If we ordered any referrals today, please let us know if you have not heard from their office within the next week.   If you had any urgent prescriptions sent in today, please check with the pharmacy within an hour of our visit to make sure the prescription was transmitted appropriately.   Please try these tips to maintain a healthy lifestyle:  Eat at least 3 REAL meals and 1-2 snacks per day.  Aim for no more than 5 hours between eating.  If you eat breakfast, please do so within one hour of getting up.   Each meal should contain half fruits/vegetables, one quarter protein, and one quarter carbs (no bigger than a computer mouse)  Cut down on sweet beverages. This includes juice, soda, and sweet tea.   Drink at least 1 glass of water with each meal and aim for at least 8 glasses per day  Exercise at least 150 minutes every week.

## 2022-09-02 NOTE — Progress Notes (Signed)
   Richard Reeves is a 72 y.o. male who presents today for an office visit.  Assessment/Plan:  Chronic Problems Addressed Today: Hemoglobinuria Has seen urology for this.  Urine microscopy has been persistently negative.  Further recommendation no further indication for workup unless is obvious RBCs on microscopy.  Patient would like to have this checked every 6 months.  Will check today.  Type 2 diabetes mellitus without complication, without long-term current use of insulin (HCC) A1c stable 7.3.  Continue metformin 1000 mg twice daily.  Come back in 6 months for recheck.  Hypertension associated with diabetes ([place]) At goal on losartan 66m daily. Check labs     Subjective:  HPI:  See A/p for status of chronic conditions. He is here today for follow up.    Since our last visit, we did send him to see urologist for microscopic hematuria. He followed up with them a couple of months ago. His urine disptick was positive however his urine microscopy was negative for RBCs. Urology advised him to follow up as needed.        Objective:  Physical Exam: BP 126/73   Pulse 64   Temp 98 F (36.7 C) (Temporal)   Ht 5' 8"$  (1.727 m)   Wt 157 lb 12.8 oz (71.6 kg)   SpO2 97%   BMI 23.99 kg/m   Gen: No acute distress, resting comfortably CV: Regular rate and rhythm with no murmurs appreciated Pulm: Normal work of breathing, clear to auscultation bilaterally with no crackles, wheezes, or rhonchi Neuro: Grossly normal, moves all extremities Psych: Normal affect and thought content      Mackay Hanauer M. PJerline Pain MD 09/02/2022 9:51 AM

## 2022-09-05 NOTE — Progress Notes (Signed)
Please inform patient of the following:  His labs are all stable.  His urine sample did not show any red blood cells under microscopy.  We can continue to check this once or twice per year as we discussed at his office visit.  Cholesterol levels are at goal.   We can recheck in 6 to 12 months.

## 2022-09-06 DIAGNOSIS — R682 Dry mouth, unspecified: Secondary | ICD-10-CM | POA: Diagnosis not present

## 2022-09-27 ENCOUNTER — Ambulatory Visit: Payer: Medicare HMO | Admitting: Family Medicine

## 2022-10-11 ENCOUNTER — Ambulatory Visit: Payer: Medicare HMO | Admitting: Family Medicine

## 2022-11-22 ENCOUNTER — Other Ambulatory Visit: Payer: Self-pay | Admitting: Family Medicine

## 2022-11-29 NOTE — Progress Notes (Unsigned)
Rubin Payor, PhD, LAT, ATC acting as a scribe for Clementeen Graham, MD.  Richard Reeves is a 72 y.o. male who presents to [facility] at [facility] today for foot pain. Pt was last seen by Dr. Denyse Amass on 03/17/22 for R knee pain and was given a steroid injection.   Today, pt reports R foot pain ongoing for about 2 months. Pt locates pain to the plantar aspect of his R foot. Pt notes the pain has worsened and now is TTP.  He has been doing home exercise program taught at the last visit by ATC.  Aggravates: transitioning to stand after rest Treatments tried: massage the area, stretching  Pertinent review of systems: No fevers or chills  Relevant historical information: Hypertension and diabetes.   Exam:  BP 138/80   Pulse 75   Ht 5\' 8"  (1.727 m)   Wt 159 lb (72.1 kg)   SpO2 98%   BMI 24.18 kg/m  General: Well Developed, well nourished, and in no acute distress.   MSK: Right foot pes planus is present.  Normal-appearing otherwise.  Normal foot and ankle motion. Tender palpation plantar calcaneus. Strength is intact.   Lab and Radiology Results  Procedure: Real-time Ultrasound Guided Injection of right plantar fascia Device: Philips Affiniti 50G Images permanently stored and available for review in PACS Verbal informed consent obtained.  Discussed risks and benefits of procedure. Warned about infection, bleeding, hyperglycemia damage to structures among others. Patient expresses understanding and agreement Time-out conducted.   Noted no overlying erythema, induration, or other signs of local infection.   Skin prepped in a sterile fashion.   Local anesthesia: Topical Ethyl chloride.   With sterile technique and under real time ultrasound guidance: 40 mg of Kenalog and 2 mL of Marcaine injected into plantar fascia origin plantar calcaneus. Fluid seen entering the plantar fascia.   Completed without difficulty   Pain immediately resolved suggesting accurate placement of  the medication.   Advised to call if fevers/chills, erythema, induration, drainage, or persistent bleeding.   Images permanently stored and available for review in the ultrasound unit.  Impression: Technically successful ultrasound guided injection.   X-ray images right calcaneus obtained today personally and independently interpreted Tiny calcaneal heel spur is present.  Otherwise normal-appearing.  No acute fractures. Await formal radiology review      Assessment and Plan: 72 y.o. male with right plantar heel pain thought to be due to Plan[place] fasciitis.  Not responding as well as I would hope to home exercise program and conservative measures.  Will proceed with injection today.  Continue home exercise program and recheck in 1 month.  Visit conducted using a [place] interpreter on the phone.   PDMP not reviewed this encounter. Orders Placed This Encounter  Procedures   DG Os Calcis Right    Standing Status:   Future    Number of Occurrences:   1    Standing Expiration Date:   11/30/2023    Order Specific Question:   Reason for Exam (SYMPTOM  OR DIAGNOSIS REQUIRED)    Answer:   eval pain plantar foot    Order Specific Question:   Preferred imaging location?    Answer:   [facility]   [place] LIMITED JOINT SPACE STRUCTURES LOW RIGHT(NO LINKED CHARGES)    Order Specific Question:   Reason for Exam (SYMPTOM  OR DIAGNOSIS REQUIRED)    Answer:   right foot pain    Order Specific Question:   Preferred  imaging location?    Answer:   [facility]   No orders of the defined types were placed in this encounter.    Discussed warning signs or symptoms. Please see discharge instructions. Patient expresses understanding.   The above documentation has been reviewed and is accurate and complete Clementeen Graham, M.D.

## 2022-11-30 ENCOUNTER — Ambulatory Visit: Payer: Medicare HMO | Admitting: Family Medicine

## 2022-11-30 ENCOUNTER — Ambulatory Visit (INDEPENDENT_AMBULATORY_CARE_PROVIDER_SITE_OTHER): Payer: Medicare HMO

## 2022-11-30 ENCOUNTER — Other Ambulatory Visit: Payer: Self-pay

## 2022-11-30 VITALS — BP 138/80 | HR 75 | Ht 68.0 in | Wt 159.0 lb

## 2022-11-30 DIAGNOSIS — M722 Plantar fascial fibromatosis: Secondary | ICD-10-CM

## 2022-11-30 DIAGNOSIS — M79671 Pain in right foot: Secondary | ICD-10-CM | POA: Diagnosis not present

## 2022-11-30 NOTE — Patient Instructions (Addendum)
Thank you for coming in today.   Please get an Xray today before you leave   You received an injection today. Seek immediate medical attention if the joint becomes red, extremely painful, or is oozing fluid.   Continue working on the home exercises  Recheck in 1 month.

## 2022-12-05 NOTE — Progress Notes (Signed)
Right heel x-ray looks normal to radiology.

## 2023-01-03 NOTE — Progress Notes (Unsigned)
   Rubin Payor, PhD, LAT, ATC acting as a scribe for Clementeen Graham, MD.  Richard Reeves is a 72 y.o. male who presents to [facility] at [facility] today for 55-month f/u R heel pain thought to be due to plantar fascitis. Pt was last seen by Dr. Denyse Amass on 11/30/22 and was given a R plantar fascia steroid injection and advised to cont HEP.   Today, pt reports R heel pain improved after prior steroid injection 60-70% better. He has cont'd HEP.   Dx imaging: 11/30/22 R os calcis XR  Pertinent review of systems: No fevers or chills  Relevant historical information: Attention diabetes   Exam:  BP 122/76   Pulse 70   Ht 5\' 8"  (1.727 m)   Wt 155 lb (70.3 kg)   SpO2 97%   BMI 23.57 kg/m  General: Well Developed, well nourished, and in no acute distress.   MSK: Right foot and ankle normal motion.  Normal gait.    Assessment and Plan: 72 y.o. male with plantar fasciitis improved with injection.  He has continued home exercise program.  Continue home exercise program and check back as needed.   PDMP not reviewed this encounter. No orders of the defined types were placed in this encounter.  No orders of the defined types were placed in this encounter.    Discussed warning signs or symptoms. Please see discharge instructions. Patient expresses understanding.   The above documentation has been reviewed and is accurate and complete Clementeen Graham, M.D. Total encounter time 20 minutes including face-to-face time with the patient and, reviewing past medical record, and charting on the date of service.

## 2023-01-04 ENCOUNTER — Ambulatory Visit: Payer: Medicare HMO | Admitting: Family Medicine

## 2023-01-04 VITALS — BP 122/76 | HR 70 | Ht 68.0 in | Wt 155.0 lb

## 2023-01-04 DIAGNOSIS — M722 Plantar fascial fibromatosis: Secondary | ICD-10-CM | POA: Diagnosis not present

## 2023-01-04 NOTE — Patient Instructions (Addendum)
Thank you for coming in today.   Check back as needed 

## 2023-02-25 ENCOUNTER — Other Ambulatory Visit: Payer: Self-pay | Admitting: Family Medicine

## 2023-03-07 ENCOUNTER — Ambulatory Visit (INDEPENDENT_AMBULATORY_CARE_PROVIDER_SITE_OTHER): Payer: Medicare HMO | Admitting: Family Medicine

## 2023-03-07 ENCOUNTER — Encounter: Payer: Self-pay | Admitting: Family Medicine

## 2023-03-07 VITALS — BP 147/80 | HR 65 | Temp 97.5°F | Ht 69.6 in | Wt 156.4 lb

## 2023-03-07 DIAGNOSIS — E1169 Type 2 diabetes mellitus with other specified complication: Secondary | ICD-10-CM

## 2023-03-07 DIAGNOSIS — E119 Type 2 diabetes mellitus without complications: Secondary | ICD-10-CM

## 2023-03-07 DIAGNOSIS — I152 Hypertension secondary to endocrine disorders: Secondary | ICD-10-CM | POA: Diagnosis not present

## 2023-03-07 DIAGNOSIS — E1159 Type 2 diabetes mellitus with other circulatory complications: Secondary | ICD-10-CM

## 2023-03-07 DIAGNOSIS — E785 Hyperlipidemia, unspecified: Secondary | ICD-10-CM | POA: Diagnosis not present

## 2023-03-07 DIAGNOSIS — Z7984 Long term (current) use of oral hypoglycemic drugs: Secondary | ICD-10-CM

## 2023-03-07 DIAGNOSIS — E038 Other specified hypothyroidism: Secondary | ICD-10-CM

## 2023-03-07 DIAGNOSIS — K649 Unspecified hemorrhoids: Secondary | ICD-10-CM

## 2023-03-07 LAB — COMPREHENSIVE METABOLIC PANEL
ALT: 17 U/L (ref 0–53)
AST: 26 U/L (ref 0–37)
Albumin: 4.5 g/dL (ref 3.5–5.2)
Alkaline Phosphatase: 51 U/L (ref 39–117)
BUN: 14 mg/dL (ref 6–23)
CO2: 28 mEq/L (ref 19–32)
Calcium: 9.5 mg/dL (ref 8.4–10.5)
Chloride: 103 mEq/L (ref 96–112)
Creatinine, Ser: 0.87 mg/dL (ref 0.40–1.50)
GFR: 87.88 mL/min (ref >60.00)
Glucose, Bld: 108 mg/dL — ABNORMAL HIGH (ref 70–99)
Potassium: 4 mEq/L (ref 3.5–5.1)
Sodium: 139 mEq/L (ref 135–145)
Total Bilirubin: 2.1 mg/dL — ABNORMAL HIGH (ref 0.2–1.2)
Total Protein: 7.2 g/dL (ref 6.0–8.3)

## 2023-03-07 LAB — TSH: TSH: 4.25 u[IU]/mL (ref 0.35–5.50)

## 2023-03-07 LAB — CBC
HCT: 39.6 % (ref 39.0–52.0)
Hemoglobin: 13 g/dL (ref 13.0–17.0)
MCHC: 32.8 g/dL (ref 30.0–36.0)
MCV: 94.1 fl (ref 78.0–100.0)
Platelets: 204 10*3/uL (ref 150.0–400.0)
RBC: 4.21 Mil/uL — ABNORMAL LOW (ref 4.22–5.81)
RDW: 13.7 % (ref 11.5–15.5)
WBC: 4.1 10*3/uL (ref 4.0–10.5)

## 2023-03-07 LAB — MICROALBUMIN / CREATININE URINE RATIO
Creatinine,U: 85.6 mg/dL
Microalb Creat Ratio: 1.7 mg/g (ref 0.0–30.0)
Microalb, Ur: 1.5 mg/dL (ref 0.0–1.9)

## 2023-03-07 LAB — LDL CHOLESTEROL, DIRECT: Direct LDL: 79 mg/dL

## 2023-03-07 LAB — LIPID PANEL
Cholesterol: 140 mg/dL (ref 0–200)
HDL: 41.2 mg/dL (ref >39.00)
NonHDL: 99.12
Total CHOL/HDL Ratio: 3
Triglycerides: 220 mg/dL — ABNORMAL HIGH (ref 0.0–149.0)
VLDL: 44 mg/dL — ABNORMAL HIGH (ref 0.0–40.0)

## 2023-03-07 LAB — POCT GLYCOSYLATED HEMOGLOBIN (HGB A1C): Hemoglobin A1C: 6.6 % — AB (ref 4.0–5.6)

## 2023-03-07 MED ORDER — HYDROCORTISONE (PERIANAL) 2.5 % EX CREA: 1.0000 | TOPICAL_CREAM | Freq: Two times a day (BID) | CUTANEOUS | 0 refills | Status: DC

## 2023-03-07 MED ORDER — METFORMIN HCL 1000 MG PO TABS: 1000.0000 mg | ORAL_TABLET | Freq: Two times a day (BID) | ORAL | 1 refills | Status: DC

## 2023-03-07 MED ORDER — DOCUSATE SODIUM 100 MG PO CAPS: 100.0000 mg | ORAL_CAPSULE | Freq: Two times a day (BID) | ORAL | 3 refills | Status: DC

## 2023-03-07 NOTE — Assessment & Plan Note (Signed)
Mildly elevated today though typically has been well controlled.  Continue losartan 50 mg daily.  Recheck at next office visit.  If persistently elevated or elevated at home we will increase to 100 mg daily.

## 2023-03-07 NOTE — Progress Notes (Addendum)
   Richard Reeves is a 72 y.o. male who presents today for an office visit.  History provided by patient via [place] interpretor.   Assessment/Plan:  New/Acute Problems: Plantar Fasciitis  Pain still not resolved and is problematic.  Advised him to continue with home exercises and call sports medicine for follow-up visit.  Chronic Problems Addressed Today: Hyperlipidemia associated with type 2 diabetes mellitus (HCC) On pravastatin 20 mg daily. Check labs today.   Type 2 diabetes mellitus without complication, without long-term current use of insulin (HCC) A1c improved to 6.6. We will continue metormin 1000 mg twice daily. Recheck in 6 months.   Hemorrhoid No red flags. This has been present for many years. We did discuss bowel regimen to prevent flareups. Encouraged hydration. We will start colace. Also start hydrocortisone cream as needed for flareups. Will need to follow up with surgery or gastroenterology if not improving.   Subclinical hypothyroidism Check labs.   Hypertension associated with diabetes (HCC) Mildly elevated today though typically has been well controlled.  Continue losartan 50 mg daily.  Recheck at next office visit.  If persistently elevated or elevated at home we will increase to 100 mg daily.     Subjective:  HPI:  See A/P for status of chronic conditions.  Patient is here today for follow-up.  Last seen 6 months ago for type 2 diabetes.At that time A1c was 7.3 and we continued metformin 1000 mg twice daily.  He has been having some issues with hemorrhoids. This has been going on for years. He was noticed to have hemorrhoids during colonoscopy a few years ago as well.   Since our last visit he was also diagnosed with plantar fasciitis and has been following with sports medicine. He did get a cortisone shot for this however symptoms have persisted. He has has been working on home exercises however pain has not improved.        Objective:  Physical Exam: BP (!)  147/80   Pulse 65   Temp (!) 97.5 F (36.4 C) (Temporal)   Ht 5' 9.6" (1.768 m)   Wt 156 lb 6.4 oz (70.9 kg)   SpO2 97%   BMI 22.70 kg/m   Gen: No acute distress, resting comfortably Neuro: Grossly normal, moves all extremities Psych: Normal affect and thought content      Richard Reeves M. Jimmey Ralph, MD 03/07/2023 10:05 AM

## 2023-03-07 NOTE — Assessment & Plan Note (Signed)
No red flags. This has been present for many years. We did discuss bowel regimen to prevent flareups. Encouraged hydration. We will start colace. Also start hydrocortisone cream as needed for flareups. Will need to follow up with surgery or gastroenterology if not improving.

## 2023-03-07 NOTE — Assessment & Plan Note (Signed)
On pravastatin 20 mg daily. Check labs today.

## 2023-03-07 NOTE — Patient Instructions (Signed)
It was very nice to see you today!  We will check blood work today.  Please try the Colace for your hemorrhoids.  Use the hydrocortisone cream if needed for flareups.  Let us know if not improving.    Please call Dr. Zollie Pee office for your foot.  Your A1c today 6.6.  Please keep up the great work your diet and exercise.  No other medication changes today.  We can recheck in 6 months.  Return in about 6 months (around 09/07/2023) for Follow Up.   Take care, Dr Jimmey Ralph  PLEASE NOTE:  If you had any lab tests, please let us know if you have not heard back within a few days. You may see your results on mychart before we have a chance to review them but we will give you a call once they are reviewed by [place].   If we ordered any referrals today, please let us know if you have not heard from their office within the next week.   If you had any urgent prescriptions sent in today, please check with the pharmacy within an hour of our visit to make sure the prescription was transmitted appropriately.   Please try these tips to maintain a healthy lifestyle:  Eat at least 3 REAL meals and 1-2 snacks per day.  Aim for no more than 5 hours between eating.  If you eat breakfast, please do so within one hour of getting up.   Each meal should contain half fruits/vegetables, one quarter protein, and one quarter carbs (no bigger than a computer mouse)  Cut down on sweet beverages. This includes juice, soda, and sweet tea.   Drink at least 1 glass of water with each meal and aim for at least 8 glasses per day  Exercise at least 150 minutes every week.

## 2023-03-07 NOTE — Assessment & Plan Note (Signed)
A1c improved to 6.6. We will continue metormin 1000 mg twice daily. Recheck in 6 months.

## 2023-03-07 NOTE — Assessment & Plan Note (Signed)
Check labs 

## 2023-03-10 NOTE — Progress Notes (Signed)
Labs are all stable.  Do not need to make any changes to his treatment plan.  We should recheck again in 6 to 12 months.

## 2023-03-15 NOTE — Progress Notes (Signed)
Spoke with patients daughter due to language barrier Date of birth verified  Lab results given,verbalized understanding

## 2023-04-05 ENCOUNTER — Telehealth: Payer: Self-pay | Admitting: Family Medicine

## 2023-04-05 NOTE — Telephone Encounter (Signed)
Lvm to schedule a diabetic eye exam for 9/26 .

## 2023-05-29 ENCOUNTER — Other Ambulatory Visit: Payer: Self-pay | Admitting: Family Medicine

## 2023-06-13 ENCOUNTER — Other Ambulatory Visit: Payer: Self-pay | Admitting: *Deleted

## 2023-06-13 ENCOUNTER — Telehealth: Payer: Self-pay | Admitting: Family Medicine

## 2023-06-13 MED ORDER — METFORMIN HCL 1000 MG PO TABS: 1000.0000 mg | ORAL_TABLET | Freq: Two times a day (BID) | ORAL | 1 refills | Status: DC

## 2023-06-13 NOTE — Telephone Encounter (Signed)
Prescription Request  06/13/2023  LOV: 03/07/2023  What is the name of the medication or equipment?  metFORMIN (GLUCOPHAGE) 1000 MG tablet    Have you contacted your pharmacy to request a refill? No   Which pharmacy would you like this sent to?  [id number] - [place], [place] - [address] [place] [address] Phone: [phone] Fax: [phone]    Patient notified that their request is being sent to the clinical staff for review and that they should receive a response within 2 business days.   Please advise at Mobile [phone] (mobile)

## 2023-06-13 NOTE — Telephone Encounter (Signed)
Rx send to [facility]

## 2023-06-23 DIAGNOSIS — R69 Illness, unspecified: Secondary | ICD-10-CM | POA: Diagnosis not present

## 2023-06-27 ENCOUNTER — Ambulatory Visit: Payer: Medicare HMO | Admitting: Family Medicine

## 2023-06-27 ENCOUNTER — Other Ambulatory Visit: Payer: Self-pay

## 2023-06-27 VITALS — BP 118/80 | HR 79 | Ht 69.0 in | Wt 163.0 lb

## 2023-06-27 DIAGNOSIS — M25561 Pain in right knee: Secondary | ICD-10-CM | POA: Diagnosis not present

## 2023-06-27 DIAGNOSIS — E1159 Type 2 diabetes mellitus with other circulatory complications: Secondary | ICD-10-CM

## 2023-06-27 DIAGNOSIS — G8929 Other chronic pain: Secondary | ICD-10-CM | POA: Diagnosis not present

## 2023-06-27 DIAGNOSIS — I152 Hypertension secondary to endocrine disorders: Secondary | ICD-10-CM

## 2023-06-27 NOTE — Progress Notes (Signed)
   Rubin Payor, PhD, LAT, ATC acting as a scribe for Clementeen Graham, MD.  Bingham Picciotto is a 72 y.o. male who presents to [facility] at [facility] today for R knee pain. His last visit w/ Dr. Denyse Amass for R knee pain was on 08/29/22 and he was given a steroid injection.  Today, pt reports R knee pain has returned over the last month. No swelling present. He is wanting a repeat CSI today.   Dx imaging: 01/20/21 R knee XR   Pertinent review of systems: No fevers or chills  Relevant historical information: Plantar fasciitis.  Hypertension and diabetes. Travel to [place] in the interval and did a lot more walking than usual.   Exam:  BP 118/80   Pulse 79   Ht 5\' 9"  (1.753 m)   Wt 163 lb (73.9 kg)   SpO2 97%   BMI 24.07 kg/m  General: Well Developed, well nourished, and in no acute distress.   MSK: Right knee mild effusion normal-appearing otherwise normal motion. Tender palpation medial joint line.    Lab and Radiology Results  Procedure: Real-time Ultrasound Guided Injection of right knee joint superior lateral patella space Device: Philips Affiniti 50G/GE Logiq Images permanently stored and available for review in PACS Verbal informed consent obtained.  Discussed risks and benefits of procedure. Warned about infection, bleeding, hyperglycemia damage to structures among others. Patient expresses understanding and agreement Time-out conducted.   Noted no overlying erythema, induration, or other signs of local infection.   Skin prepped in a sterile fashion.   Local anesthesia: Topical Ethyl chloride.   With sterile technique and under real time ultrasound guidance: 40 mg of Kenalog and 2 mL of Marcaine injected into knee joint. Fluid seen entering the joint capsule.   Completed without difficulty   Pain immediately resolved suggesting accurate placement of the medication.   Advised to call if fevers/chills, erythema, induration, drainage, or persistent bleeding.    Images permanently stored and available for review in the ultrasound unit.  Impression: Technically successful ultrasound guided injection.       Assessment and Plan: 72 y.o. male with right knee pain due to exacerbation of DJD.  This is an acute exacerbation of a chronic problem.  Plan on repeat steroid injection today.  Avoid high-dose oral NSAIDs given hypertension and diabetes. Recheck as needed.  Continue quad strengthening exercises.  PDMP not reviewed this encounter. Orders Placed This Encounter  Procedures   [place] LIMITED JOINT SPACE STRUCTURES LOW RIGHT(NO LINKED CHARGES)    Order Specific Question:   Reason for Exam (SYMPTOM  OR DIAGNOSIS REQUIRED)    Answer:   right knee pain    Order Specific Question:   Preferred imaging location?    Answer:   [facility]   No orders of the defined types were placed in this encounter.    Discussed warning signs or symptoms. Please see discharge instructions. Patient expresses understanding.   The above documentation has been reviewed and is accurate and complete Clementeen Graham, M.D.

## 2023-06-27 NOTE — Patient Instructions (Addendum)
Thank you for coming in today.   You received an injection today. Seek immediate medical attention if the joint becomes red, extremely painful, or is oozing fluid.   Check back as needed 

## 2023-07-10 ENCOUNTER — Telehealth: Payer: Self-pay | Admitting: Family Medicine

## 2023-07-10 MED ORDER — MELOXICAM 7.5 MG PO TABS: 7.5000 mg | ORAL_TABLET | Freq: Every day | ORAL | 0 refills | Status: DC

## 2023-07-10 NOTE — Telephone Encounter (Signed)
Patient stopped by the office requesting a refill on meloxicam (MOBIC) 7.5 MG tablet to [facility] on [address] (this is a new pharmacy).

## 2023-07-10 NOTE — Telephone Encounter (Signed)
Last OV 06/27/23 Next OV not scheduled  Last refill 10/05/21 Qty #30/3   Lab renal labs 03/07/23 - elevated bilirubin. Pt also had elevated BP at OV 03/07/23 - 147/80. Forwarding to Dr. Katrinka Blazing to advise if OK to refill.           Component Ref Range & Units (hover) 4 mo ago (03/07/23) 10 mo ago (09/02/22) 1 yr ago (04/05/22) 1 yr ago (09/21/21) 2 yr ago (03/08/21) 2 yr ago (02/23/21) 2 yr ago (10/13/20)  Sodium 139 139 137 139 139 139 140  Potassium 4.0 4.5 4.4 4.7 3.6 4.0 4.3  Chloride 103 101 100 103 104 102 104  CO2 28 29 31 30 25 28 29   Glucose, Bld 108 High  123 High  127 High  110 High  93 96 112 High   BUN 14 17 20 16 24  High  18 16  Creatinine, Ser 0.87 0.98 0.98 1.04 0.93 0.89 0.84  Total Bilirubin 2.1 High  1.9 High  1.4 High  2.0 High  2.1 High  2.7 High  2.0 High   Alkaline Phosphatase 51 62 50 67 60 63 64  AST 26 24 16 31 23 22 24   ALT 17 17 14 20 15 13 17   Total Protein 7.2 7.5 7.1 7.1 7.3 7.3 7.1  Albumin 4.5 4.9 4.2 4.7 4.5 4.6 4.7  GFR 87.88 78.82 CM 79.05 CM 73.89 CM 84.81 CM 88.54 CM 90.33 CM  Comment: Calculated using the CKD-EPI Creatinine Equation (2021)  Calcium 9.5 9.9 9.6 9.8 9.1 9.5 9.5

## 2023-07-10 NOTE — Telephone Encounter (Signed)
RX refilled x 30 days per Dr. Katrinka Blazing. Dr. Denyse Amass, does pt need f/u visit to discuss sx management and abnormal labs/BP?

## 2023-07-17 NOTE — Telephone Encounter (Signed)
I do not think he needs an appointment with me.  Okay to refill next time he needs a refill.

## 2023-09-07 ENCOUNTER — Ambulatory Visit: Payer: Medicare HMO | Admitting: Family Medicine

## 2023-09-12 ENCOUNTER — Ambulatory Visit (INDEPENDENT_AMBULATORY_CARE_PROVIDER_SITE_OTHER): Payer: Medicare HMO | Admitting: Family Medicine

## 2023-09-12 ENCOUNTER — Encounter: Payer: Self-pay | Admitting: Family Medicine

## 2023-09-12 VITALS — BP 123/73 | HR 61 | Temp 97.2°F | Ht 69.0 in | Wt 159.2 lb

## 2023-09-12 DIAGNOSIS — Z7984 Long term (current) use of oral hypoglycemic drugs: Secondary | ICD-10-CM | POA: Diagnosis not present

## 2023-09-12 DIAGNOSIS — I152 Hypertension secondary to endocrine disorders: Secondary | ICD-10-CM

## 2023-09-12 DIAGNOSIS — E1169 Type 2 diabetes mellitus with other specified complication: Secondary | ICD-10-CM | POA: Diagnosis not present

## 2023-09-12 DIAGNOSIS — E1159 Type 2 diabetes mellitus with other circulatory complications: Secondary | ICD-10-CM | POA: Diagnosis not present

## 2023-09-12 DIAGNOSIS — E785 Hyperlipidemia, unspecified: Secondary | ICD-10-CM | POA: Diagnosis not present

## 2023-09-12 DIAGNOSIS — E119 Type 2 diabetes mellitus without complications: Secondary | ICD-10-CM

## 2023-09-12 DIAGNOSIS — E038 Other specified hypothyroidism: Secondary | ICD-10-CM

## 2023-09-12 LAB — POCT GLYCOSYLATED HEMOGLOBIN (HGB A1C): Hemoglobin A1C: 7.3 % — AB (ref 4.0–5.6)

## 2023-09-12 LAB — LIPID PANEL
Cholesterol: 154 mg/dL (ref 0–200)
HDL: 48.4 mg/dL (ref >39.00)
LDL Cholesterol: 72 mg/dL (ref 0–99)
NonHDL: 105.59
Total CHOL/HDL Ratio: 3
Triglycerides: 166 mg/dL — ABNORMAL HIGH (ref 0.0–149.0)
VLDL: 33.2 mg/dL (ref 0.0–40.0)

## 2023-09-12 LAB — COMPREHENSIVE METABOLIC PANEL
ALT: 19 U/L (ref 0–53)
AST: 29 U/L (ref 0–37)
Albumin: 4.6 g/dL (ref 3.5–5.2)
Alkaline Phosphatase: 53 U/L (ref 39–117)
BUN: 20 mg/dL (ref 6–23)
CO2: 27 meq/L (ref 19–32)
Calcium: 9.1 mg/dL (ref 8.4–10.5)
Chloride: 102 meq/L (ref 96–112)
Creatinine, Ser: 1.05 mg/dL (ref 0.40–1.50)
GFR: 72.04 mL/min (ref >60.00)
Glucose, Bld: 140 mg/dL — ABNORMAL HIGH (ref 70–99)
Potassium: 4.3 meq/L (ref 3.5–5.1)
Sodium: 138 meq/L (ref 135–145)
Total Bilirubin: 1.9 mg/dL — ABNORMAL HIGH (ref 0.2–1.2)
Total Protein: 7.3 g/dL (ref 6.0–8.3)

## 2023-09-12 LAB — CBC
HCT: 38.9 % — ABNORMAL LOW (ref 39.0–52.0)
Hemoglobin: 13.1 g/dL (ref 13.0–17.0)
MCHC: 33.6 g/dL (ref 30.0–36.0)
MCV: 93.9 fl (ref 78.0–100.0)
Platelets: 193 10*3/uL (ref 150.0–400.0)
RBC: 4.15 Mil/uL — ABNORMAL LOW (ref 4.22–5.81)
RDW: 14 % (ref 11.5–15.5)
WBC: 4.2 10*3/uL (ref 4.0–10.5)

## 2023-09-12 LAB — MICROALBUMIN / CREATININE URINE RATIO
Creatinine,U: 121 mg/dL
Microalb Creat Ratio: 25 mg/g (ref 0.0–30.0)
Microalb, Ur: 3 mg/dL — ABNORMAL HIGH (ref 0.0–1.9)

## 2023-09-12 LAB — TSH: TSH: 6.24 u[IU]/mL — ABNORMAL HIGH (ref 0.35–5.50)

## 2023-09-12 MED ORDER — LOSARTAN POTASSIUM 50 MG PO TABS: 50.0000 mg | ORAL_TABLET | Freq: Every day | ORAL | 3 refills | Status: DC

## 2023-09-12 MED ORDER — METFORMIN HCL 1000 MG PO TABS: 1000.0000 mg | ORAL_TABLET | Freq: Two times a day (BID) | ORAL | 3 refills | Status: AC

## 2023-09-12 MED ORDER — PRAVASTATIN SODIUM 20 MG PO TABS: 20.0000 mg | ORAL_TABLET | Freq: Every day | ORAL | 3 refills | Status: DC

## 2023-09-12 NOTE — Progress Notes (Signed)
   Ridhaan Dreibelbis is a 73 y.o. male who presents today for an office visit.  History provided by patient via [place] interpretor.   Assessment/Plan:  Chronic Problems Addressed Today: Type 2 diabetes mellitus without complication, without long-term current use of insulin (HCC) A1c up slightly to 7.3.  He does admit to a few dietary indiscretions.  We will continue metformin 1000 mg twice daily.  He will continue to work on diet and exercise.  Recheck in 6 months.  Hyperlipidemia associated with type 2 diabetes mellitus (HCC) Stable on pravastatin 20 mg daily.  Check labs.  Subclinical hypothyroidism Check labs.   Hypertension associated with diabetes (HCC) Blood pressure at goal.  Continue losartan 50 mg daily.  Check labs.     Subjective:  HPI:  See Assessment / plan for status of chronic conditions. Patient here today for diabetes follow up. We last saw him about 6 months ago. At that time his A1c was 6.6 on metformin 1000 mg twice daily. He has done well the last 6 months. He has been consistent with his medications. He is working on his diet and exercise.        Objective:  Physical Exam: BP 123/73   Pulse 61   Temp (!) 97.2 F (36.2 C) (Temporal)   Ht 5\' 9"  (1.753 m)   Wt 159 lb 3.2 oz (72.2 kg)   SpO2 96%   BMI 23.51 kg/m   Gen: No acute distress, resting comfortably CV: Regular rate and rhythm with no murmurs appreciated Pulm: Normal work of breathing, clear to auscultation bilaterally with no crackles, wheezes, or rhonchi Neuro: Grossly normal, moves all extremities Psych: Normal affect and thought content      Shelonda Saxe M. Jimmey Ralph, MD 09/12/2023 9:50 AM

## 2023-09-12 NOTE — Patient Instructions (Signed)
 It was very nice to see you today!  I will refill your patients today.  Your sugar is up a little bit.  We will not make any medication changes however please continue to work on diet and exercise.  Return in about 6 months (around 03/11/2024) for Annual Physical.   Take care, Dr Jimmey Ralph  PLEASE NOTE:  If you had any lab tests, please let us know if you have not heard back within a few days. You may see your results on mychart before we have a chance to review them but we will give you a call once they are reviewed by [place].   If we ordered any referrals today, please let us know if you have not heard from their office within the next week.   If you had any urgent prescriptions sent in today, please check with the pharmacy within an hour of our visit to make sure the prescription was transmitted appropriately.   Please try these tips to maintain a healthy lifestyle:  Eat at least 3 REAL meals and 1-2 snacks per day.  Aim for no more than 5 hours between eating.  If you eat breakfast, please do so within one hour of getting up.   Each meal should contain half fruits/vegetables, one quarter protein, and one quarter carbs (no bigger than a computer mouse)  Cut down on sweet beverages. This includes juice, soda, and sweet tea.   Drink at least 1 glass of water with each meal and aim for at least 8 glasses per day  Exercise at least 150 minutes every week.

## 2023-09-12 NOTE — Assessment & Plan Note (Signed)
 Blood pressure at goal.  Continue losartan 50 mg daily.  Check labs.

## 2023-09-12 NOTE — Assessment & Plan Note (Signed)
 A1c up slightly to 7.3.  He does admit to a few dietary indiscretions.  We will continue metformin 1000 mg twice daily.  He will continue to work on diet and exercise.  Recheck in 6 months.

## 2023-09-12 NOTE — Assessment & Plan Note (Signed)
 Check labs

## 2023-09-12 NOTE — Assessment & Plan Note (Signed)
 Stable on pravastatin 20 mg daily.  Check labs.

## 2023-09-14 NOTE — Progress Notes (Signed)
 Labs are all stable.  Do not need to make any changes to his treatment plan at this time.  We can recheck again in 6 months.

## 2023-09-28 ENCOUNTER — Ambulatory Visit (INDEPENDENT_AMBULATORY_CARE_PROVIDER_SITE_OTHER): Admitting: Physician Assistant

## 2023-09-28 ENCOUNTER — Encounter: Payer: Self-pay | Admitting: Physician Assistant

## 2023-09-28 VITALS — BP 122/76 | HR 92 | Temp 98.1°F | Ht 69.0 in | Wt 156.4 lb

## 2023-09-28 DIAGNOSIS — J029 Acute pharyngitis, unspecified: Secondary | ICD-10-CM

## 2023-09-28 DIAGNOSIS — R52 Pain, unspecified: Secondary | ICD-10-CM | POA: Diagnosis not present

## 2023-09-28 LAB — POCT INFLUENZA A/B
Influenza A, POC: NEGATIVE
Influenza B, POC: NEGATIVE

## 2023-09-28 LAB — POCT RAPID STREP A (OFFICE): Rapid Strep A Screen: NEGATIVE

## 2023-09-28 LAB — POC COVID19 BINAXNOW: SARS Coronavirus 2 Ag: NEGATIVE

## 2023-09-28 MED ORDER — BENZONATATE 100 MG PO CAPS: 100.0000 mg | ORAL_CAPSULE | Freq: Three times a day (TID) | ORAL | 1 refills | Status: DC | PRN

## 2023-09-28 MED ORDER — AMOXICILLIN-POT CLAVULANATE 875-125 MG PO TABS: 1.0000 | ORAL_TABLET | Freq: Two times a day (BID) | ORAL | 0 refills | Status: DC

## 2023-09-28 NOTE — Progress Notes (Signed)
 Richard Reeves is a 73 y.o. male here for a new problem.  History of Present Illness:   Chief Complaint  Patient presents with   Cough    Pt states been going on since Saturday chili body aches runny nose sore throat.    HPI - Virtual interpreter is present for this visit.   Cough: Pt complains of productive cough, body aches, runny nose, chills, and sore throat starting Saturday 3/8.  States his most severe sx are body aches and runny nose.  Also endorses abdominal pain when coughing and urinary frequency, which pt attributes to increase in water intake.  Has been taking 2 tabs of Tylenol Cold every 8 hours to manage his body aches.  Last took Tylenol at 1 AM.  No changes to symptoms since onset; has not improved or worsened.  Denies any fever, shortness of breath, or dysuria, burning sensation, or eat pain.  No known recent exposure to any illnesses. No recent travel.   Past Medical History:  Diagnosis Date   Diabetes mellitus without complication (HCC)    Hyperlipidemia      Social History   Tobacco Use   Smoking status: Former   Smokeless tobacco: Never  Vaping Use   Vaping status: Never Used  Substance Use Topics   Alcohol use: No    Alcohol/week: 0.0 standard drinks of alcohol   Drug use: No    History reviewed. No pertinent surgical history.  Family History  Problem Relation Age of Onset   Cancer Mother    Colon cancer Neg Hx    Esophageal cancer Neg Hx    Inflammatory bowel disease Neg Hx    Liver disease Neg Hx    Pancreatic cancer Neg Hx    Rectal cancer Neg Hx    Stomach cancer Neg Hx     No Known Allergies  Current Medications:   Current Outpatient Medications:    amoxicillin-clavulanate (AUGMENTIN) 875-125 MG tablet, Take 1 tablet by mouth 2 (two) times daily., Disp: 14 tablet, Rfl: 0   aspirin EC 81 MG tablet, Take 81 mg by mouth daily., Disp: , Rfl:    benzonatate (TESSALON PERLES) 100 MG capsule, Take 1 capsule (100 mg total) by mouth 3 (three)  times daily as needed., Disp: 30 capsule, Rfl: 1   blood glucose meter kit and supplies KIT, Dispense based on patient and insurance preference. Use daily as needed to check blood sugar., Disp: 1 each, Rfl: 0   losartan (COZAAR) 50 MG tablet, Take 1 tablet (50 mg total) by mouth daily., Disp: 90 tablet, Rfl: 3   metFORMIN (GLUCOPHAGE) 1000 MG tablet, Take 1 tablet (1,000 mg total) by mouth 2 (two) times daily with a meal., Disp: 180 tablet, Rfl: 3   pravastatin (PRAVACHOL) 20 MG tablet, Take 1 tablet (20 mg total) by mouth daily., Disp: 90 tablet, Rfl: 3   Review of Systems:   Negative unless otherwise specified per HPI.  Vitals:   Vitals:   09/28/23 1040  BP: 122/76  Pulse: 92  Temp: 98.1 F (36.7 C)  TempSrc: Temporal  SpO2: 98%  Weight: 156 lb 6.4 oz (70.9 kg)  Height: 5\' 9"  (1.753 m)     Body mass index is 23.1 kg/m.  Physical Exam:   Physical Exam Vitals and nursing note reviewed.  Constitutional:      General: He is not in acute distress.    Appearance: He is well-developed. He is not ill-appearing or toxic-appearing.  HENT:  Head: Normocephalic and atraumatic.     Right Ear: Tympanic membrane, ear canal and external ear normal. Tympanic membrane is not erythematous, retracted or bulging.     Left Ear: Tympanic membrane, ear canal and external ear normal. Tympanic membrane is not erythematous, retracted or bulging.     Nose:     Right Sinus: Frontal sinus tenderness present. No maxillary sinus tenderness.     Left Sinus: Frontal sinus tenderness present. No maxillary sinus tenderness.     Mouth/Throat:     Pharynx: Uvula midline. No posterior oropharyngeal erythema.  Eyes:     General: Lids are normal.     Conjunctiva/sclera: Conjunctivae normal.  Neck:     Trachea: Trachea normal.  Cardiovascular:     Rate and Rhythm: Normal rate and regular rhythm.     Heart sounds: Normal heart sounds, S1 normal and S2 normal.  Pulmonary:     Effort: Pulmonary effort is  normal.     Breath sounds: Normal breath sounds. No decreased breath sounds, wheezing, rhonchi or rales.  Lymphadenopathy:     Cervical: No cervical adenopathy.  Skin:    General: Skin is warm and dry.  Neurological:     Mental Status: He is alert.  Psychiatric:        Speech: Speech normal.        Behavior: Behavior normal. Behavior is cooperative.    Results for orders placed or performed in visit on 09/28/23  POCT rapid strep A  Result Value Ref Range   Rapid Strep A Screen Negative Negative  POCT Influenza A/B  Result Value Ref Range   Influenza A, POC Negative Negative   Influenza B, POC Negative Negative  POC COVID-19 BinaxNow  Result Value Ref Range   SARS Coronavirus 2 Ag Negative Negative    Assessment and Plan:   Sore throat (Primary) and Body Aches No red flags on exam.   Point-of-care testing is all negative Will initiate Augmentin and Tessalon Perles per orders.  Discussed taking medications as prescribed.  Reviewed return precautions including new or worsening fever, SOB, new or worsening cough or other concerns.  Push fluids and rest.  I recommend that patient follow-up if symptoms worsen or persist despite treatment x 7-10 days, sooner if needed.  - POCT rapid strep A - POCT Influenza A/B - POC COVID-19 BinaxNow    I, Isabelle Course, acting as a Neurosurgeon for [employer], [place]., have documented all relevant documentation on the behalf of Jarold Motto, [place], as directed by  Jarold Motto, PA while in the presence of Jarold Motto, [place].  I, Jarold Motto, [place], have reviewed all documentation for this visit. The documentation on 09/28/23 for the exam, diagnosis, procedures, and orders are all accurate and complete.  Jarold Motto, PA-C

## 2023-09-28 NOTE — Patient Instructions (Signed)
 It was great to see you!  I think you have a sinus infection Start Augmentin antibiotic(s)  Please consider OTC (available over the counter without a prescription) medication ibuprofen 400-600 mg with food 2-3 times daily  If any worsening symptom(s), please reach out and follow-up   Take care,  Jarold Motto PA-C

## 2023-11-20 ENCOUNTER — Other Ambulatory Visit: Payer: Self-pay

## 2023-11-20 ENCOUNTER — Encounter: Payer: Self-pay | Admitting: Family Medicine

## 2023-11-20 ENCOUNTER — Ambulatory Visit (INDEPENDENT_AMBULATORY_CARE_PROVIDER_SITE_OTHER)

## 2023-11-20 ENCOUNTER — Ambulatory Visit (INDEPENDENT_AMBULATORY_CARE_PROVIDER_SITE_OTHER): Admitting: Family Medicine

## 2023-11-20 VITALS — BP 130/82 | HR 77 | Ht 69.0 in | Wt 161.0 lb

## 2023-11-20 DIAGNOSIS — M1711 Unilateral primary osteoarthritis, right knee: Secondary | ICD-10-CM | POA: Diagnosis not present

## 2023-11-20 DIAGNOSIS — M25561 Pain in right knee: Secondary | ICD-10-CM | POA: Diagnosis not present

## 2023-11-20 DIAGNOSIS — G8929 Other chronic pain: Secondary | ICD-10-CM

## 2023-11-20 MED ORDER — LOSARTAN POTASSIUM 50 MG PO TABS: 50.0000 mg | ORAL_TABLET | Freq: Every day | ORAL | 3 refills | Status: AC

## 2023-11-20 MED ORDER — PRAVASTATIN SODIUM 20 MG PO TABS: 20.0000 mg | ORAL_TABLET | Freq: Every day | ORAL | 3 refills | Status: AC

## 2023-11-20 NOTE — Patient Instructions (Addendum)
 Thank you for coming in today.   You received an injection today. Seek immediate medical attention if the joint becomes red, extremely painful, or is oozing fluid.   Please get an Xray today before you leave.  A referral for physical therapy has been submitted. A representative from the physical therapy office will contact you to coordinate scheduling after confirming your benefits with your insurance provider. If you do not hear from the physical therapy office within the next 1-2 weeks, please let us  know.   See you back as needed.

## 2023-11-20 NOTE — Progress Notes (Signed)
 I, Miquel Amen, CMA acting as a scribe for Garlan Juniper, MD.  Richard Reeves is a 73 y.o. male who presents to [facility] at [facility] today for R knee pain. Pt was last seen by Dr. Alease Hunter on 06/27/23 and was given a repeat R knee steroid injection and advised to cont quad strengthening. Avoid high-dose oral NSAIDs.   Today, pt reports exacerbation of right knee pain over the past month. Denies swelling. Denies mechanical sx. Locates pain distal to patella. Denies new injury. Has not been working on quad strengthening regularly d/t knee pain. Pain worse with knee flexion, squatting. Has not taken any meds for this exacerbation.   Dx imaging: 03/17/22 R knee XR 01/20/21 R knee XR   Pertinent review of systems: No fevers or chills  Relevant historical information: Hypertension and diabetes   Exam:  BP 130/82   Pulse 77   Ht 5\' 9"  (1.753 m)   Wt 161 lb (73 kg)   SpO2 96%   BMI 23.78 kg/m  General: Well Developed, well nourished, and in no acute distress.   MSK: Right knee decreased quadricep bulk.  Normal knee motion.    Lab and Radiology Results  Procedure: Real-time Ultrasound Guided Injection of right knee joint superior lateral patella space Device: Philips Affiniti 50G/GE Logiq Images permanently stored and available for review in PACS Verbal informed consent obtained.  Discussed risks and benefits of procedure. Warned about infection, bleeding, hyperglycemia damage to structures among others. Patient expresses understanding and agreement Time-out conducted.   Noted no overlying erythema, induration, or other signs of local infection.   Skin prepped in a sterile fashion.   Local anesthesia: Topical Ethyl chloride.   With sterile technique and under real time ultrasound guidance: 40 mg of Kenalog and 2 mL of Marcaine injected into knee joint. Fluid seen entering the joint capsule.   Completed without difficulty   Pain immediately resolved suggesting accurate  placement of the medication.   Advised to call if fevers/chills, erythema, induration, drainage, or persistent bleeding.   Images permanently stored and available for review in the ultrasound unit.  Impression: Technically successful ultrasound guided injection.   X-ray images right knee obtained today personally and independently interpreted. Mild DJD medial compartment Await formal radiology review   Assessment and Plan: 73 y.o. male with right knee pain due to DJD and some degenerative medial meniscus tearing.  Plan for repeat steroid injection today.  He started to experience quadricep muscle weakness and muscle bulk loss.  Plan for physical therapy which should be helpful.   PDMP not reviewed this encounter. Orders Placed This Encounter  Procedures   [place]  LIMITED JOINT SPACE STRUCTURES LOW RIGHT(NO LINKED CHARGES)    Reason for Exam (SYMPTOM  OR DIAGNOSIS REQUIRED):   right knee pain    Preferred imaging location?:   [facility] Knee AP/LAT W/[place] Right    Standing Status:   Future    Number of Occurrences:   1    Expiration Date:   12/21/2023    Reason for Exam (SYMPTOM  OR DIAGNOSIS REQUIRED):   right knee pain    Preferred imaging location?:   [facility]   Ambulatory referral to Physical Therapy    Referral Priority:   Routine    Referral Type:   Physical Medicine    Referral Reason:   Specialty Services Required    Requested Specialty:   Physical Therapy    Number of Visits  Requested:   1   No orders of the defined types were placed in this encounter.    Discussed warning signs or symptoms. Please see discharge instructions. Patient expresses understanding.   The above documentation has been reviewed and is accurate and complete Garlan Juniper, M.D. [place] interpreter used for today's visit

## 2023-11-30 ENCOUNTER — Ambulatory Visit: Payer: Self-pay | Admitting: Family Medicine

## 2023-11-30 NOTE — Progress Notes (Signed)
Right knee x-ray looks normal to radiology

## 2023-12-18 NOTE — Therapy (Addendum)
 OUTPATIENT PHYSICAL THERAPY LOWER EXTREMITY EVALUATION   Patient Name: Richard Reeves MRN: [id number] DOB:08-27-1950, 73 y.o., male Today's Date: 01/01/2024  END OF SESSION:    12/19/23 1348  PT Visits / Re-Eval  Visit Number 1  Number of Visits 16  Date for PT Re-Evaluation 03/12/24  PT Time Calculation  PT Start Time 1349  PT Stop Time 1425  PT Time Calculation (min) 36 min  PT - End of Session  Activity Tolerance Patient tolerated treatment well  Behavior During Therapy WFL for tasks assessed/performed    Past Medical History:  Diagnosis Date   Diabetes mellitus without complication (HCC)    Hyperlipidemia    History reviewed. No pertinent surgical history. 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   Allergic rhinitis 09/21/2021   Glaucoma 09/21/2021   Knee pain 03/16/2021   Hypertension associated with diabetes (HCC) 02/23/2021   Subclinical hypothyroidism 11/02/2020   History of Helicobacter infection 12/12/2018   Intestinal metaplasia of gastric mucosa 12/12/2018   Gastritis 12/12/2018   History of colonic polyps 09/04/2018   Gastroesophageal reflux disease 09/04/2018   Fatty liver 09/04/2018   Abnormal LFTs 09/04/2018   Gallstones 09/04/2018   Biliary colic 07/24/2018   Type 2 diabetes mellitus without complication, without long-term current use of insulin (HCC) 07/10/2017   Hyperlipidemia associated with type 2 diabetes mellitus (HCC) 07/10/2017   Hyperbilirubinemia 07/10/2017   Swelling of lower extremity 07/10/2017    PCP: Rodney Clamp, MD  REFERRING PROVIDER: Syliva Even, MD  REFERRING DIAG: [phone] (ICD-10-CM) - Chronic pain of right knee M17.11 (ICD-10-CM) - Primary osteoarthritis of right knee  THERAPY DIAG:  Chronic pain of right knee - Plan: PT plan of care cert/re-cert  Muscle weakness (generalized) - Plan: PT plan of care cert/re-cert  Difficulty in walking, not  elsewhere classified - Plan: PT plan of care cert/re-cert  Rationale for Evaluation and Treatment: Rehabilitation  ONSET DATE: a while   SUBJECTIVE:   SUBJECTIVE STATEMENT: Video interpreter services used - first Arlyce Lambert then Bobbye Burrow - slight connectivity issues   Patient reports pain has been there a while but recently increased. Received injection from MD in right knee that seemed to help. MD notes reduced strength in legs and wants him to work out. It is painful to workout so he doesn't. States when he walks he also has pain in the back of his legs after 10-15 minutes. Previously he could walk a lot longer without pain.   Uses Voltaren  gel but no other medications. Also feels like his right leg is longer and he trips over it with walking.    PERTINENT HISTORY: Right knee pain, DB, cervical disc issues PAIN:  Are you having pain? Yes: NPRS scale: 6/10 Pain location: right knee  Pain description: NA Aggravating factors: movement Relieving factors: rest  PRECAUTIONS: None  RED FLAGS: None   WEIGHT BEARING RESTRICTIONS: No  FALLS:  Has patient fallen in last 6 months? No    PLOF: Independent  PATIENT GOALS: to be able to walk more     OBJECTIVE:  Note: Objective measures were completed at Evaluation unless otherwise noted.  DIAGNOSTIC FINDINGS:   Right knee xray 11/20/23 FINDINGS: No evidence of fracture, dislocation, or joint effusion. No evidence of arthropathy or other focal bone abnormality. Soft tissues are unremarkable.   IMPRESSION: Negative.  PATIENT SURVEYS:  Patient-specific activity functional scoring scheme (Point to one number):  0 represents "unable to perform."  10 represents "able to perform at prior level. 0 1 2 3 4 5 6 7 8 9  10 (Date and Score) Activity Initial  Activity Eval     Walking > 15 minutes without pain  0    Squatting 5    stairs 5    Additional Additional Total score = sum of the activity scores/number of  activities Minimum detectable change (90%CI) for average score = 2 points Minimum detectable change (90%CI) for single activity score = 3 points PSFS developed by: [place] Spitz., & Binkley, J. (1995). Assessing disability and change on individual  patients: a report of a patient specific measure. Physiotherapy [place], 47, [phone]. Reproduced with the permission of the authors  Score: 10/3=3.33   COGNITION: Overall cognitive status: Within functional limits for tasks assessed     SENSATION: Not tested  EDEMA:  None noted    POSTURE: decreased lumbar lordosis  PALPATION: No tenderness to palpation noted in hips, back or right lower extremity    LE Measurements Lower Extremity Right EVAL Left EVAL   A/PROM MMT A/PROM MMT  Hip Flexion  3+  3+  Hip Extension 5 3+ 5 3+  Hip Abduction      Hip Adduction      Hip Internal rotation 20  30   Hip External rotation 45  45   Knee Flexion 120* 4- 130 4-  Knee Extension 0 4- 0 4  Ankle Dorsiflexion  4+  4+  Ankle Plantarflexion      Ankle Inversion      Ankle Eversion       (Blank rows = not tested) * pain   LOWER EXTREMITY SPECIAL TESTS:  Leg length test: Right longer with supine to sit test Negative Ely's bilaterally   GAIT: Distance walked: 25 feet within clinic Assistive device utilized: None Level of assistance: Complete Independence Comments: Wide gait limited hip range of motion                                                                                                                                TREATMENT DATE:   01/01/2024  Therapeutic Exercise:  Aerobic: Supine: Bridge x 15 5 seconds, leg lower to 45 degrees 3 x 5 Prone:  Seated:  Standing: Neuromuscular Re-education: Manual Therapy: Therapeutic Activity: Self Care: Trigger Point Dry Needling:  Modalities:    PATIENT EDUCATION:  Education details: on current presentation, on HEP, on clinical outcomes score and  POC Person educated: Patient Education method: Explanation, Demonstration, and Handouts Education comprehension: verbalized understanding   HOME EXERCISE PROGRAM: [id number] Leg lower for core  ASSESSMENT:  CLINICAL IMPRESSION: Patient presents to physical therapy with complaints of right knee pain. Interpreter services used but at times connectivity was in and out which made for mild difficulties in explaining exercises and presentation with patient. Will revisit education of findings next session to make sure patient understands. Patient presents with pain, ROM  and strength deficits that are likely contributing to current presentation and would greatly benefit from skilled PT to improve overall function at home and in the community.    OBJECTIVE IMPAIRMENTS: decreased activity tolerance, decreased mobility, difficulty walking, decreased ROM, decreased strength, and pain.   ACTIVITY LIMITATIONS: stairs, transfers, and locomotion level  PARTICIPATION LIMITATIONS: community activity  PERSONAL FACTORS: Age, Fitness, and Time since onset of injury/illness/exacerbation are also affecting patient's functional outcome.   REHAB POTENTIAL: Good  CLINICAL DECISION MAKING: Stable/uncomplicated  EVALUATION COMPLEXITY: Low   GOALS: Goals reviewed with patient? yes  SHORT TERM GOALS: Target date: 01/30/2024   Patient will be independent in self management strategies to improve quality of life and functional outcomes. Baseline: New Program Goal status: INITIAL  2.  Patient will report at least 50% improvement in overall symptoms and/or function to demonstrate improved functional mobility Baseline: 0% better Goal status: INITIAL  3.  Patient will be able to demonstrate at least 4/5 MMT in B LE Baseline:see above   Goal status: INITIAL       LONG TERM GOALS: Target date: 03/12/2024    Patient will report at least 75% improvement in overall symptoms and/or function to demonstrate  improved functional mobility Baseline: 0% better Goal status: INITIAL  2.  Patient will score at least 2 points higher on PSFS average to demonstrate change in overall function. Baseline: see above Goal status: INITIAL  3.  Patient will demonstrate full pain free right knee ROM  Baseline: painful Goal status: INITIAL  4.  Patient will be able to walk 30 minutes without pain to improve community ambulation  Baseline: 10-15 minutes without pain  Goal status: INITIAL    PLAN:  PT FREQUENCY: 1-2x/week for a total of 16 visits over 12 week certification period  PT DURATION: 12 weeks  PLANNED INTERVENTIONS: 97110-Therapeutic exercises, 97530- Therapeutic activity, 97112- Neuromuscular re-education, 97535- Self Care, 40981- Manual therapy, [phone]- Gait training, [phone]- Orthotic Fit/training, [phone]- Canalith repositioning, [id number]- Aquatic Therapy, [id number]- Electrical stimulation (unattended), [phone]- Ionotophoresis 4mg /ml Dexamethasone, Patient/Family education, Balance training, Stair training, Taping, Dry Needling, Joint mobilization, Joint manipulation, Spinal manipulation, Spinal mobilization, Cryotherapy, and Moist heat   PLAN FOR NEXT SESSION: LE and core strengthening, thigh stretches    8:20 AM, 01/01/24 Tabitha Ewings, DPT Physical Therapy with [facility]

## 2023-12-19 ENCOUNTER — Encounter: Payer: Self-pay | Admitting: Physical Therapy

## 2023-12-19 ENCOUNTER — Ambulatory Visit: Admitting: Physical Therapy

## 2023-12-19 DIAGNOSIS — G8929 Other chronic pain: Secondary | ICD-10-CM

## 2023-12-19 DIAGNOSIS — M6281 Muscle weakness (generalized): Secondary | ICD-10-CM | POA: Diagnosis not present

## 2023-12-19 DIAGNOSIS — R262 Difficulty in walking, not elsewhere classified: Secondary | ICD-10-CM

## 2023-12-19 DIAGNOSIS — M25561 Pain in right knee: Secondary | ICD-10-CM

## 2023-12-21 ENCOUNTER — Encounter: Admitting: Physical Therapy

## 2024-01-02 ENCOUNTER — Ambulatory Visit: Admitting: Physical Therapy

## 2024-01-02 ENCOUNTER — Encounter: Payer: Self-pay | Admitting: Physical Therapy

## 2024-01-02 DIAGNOSIS — M25561 Pain in right knee: Secondary | ICD-10-CM | POA: Diagnosis not present

## 2024-01-02 DIAGNOSIS — G8929 Other chronic pain: Secondary | ICD-10-CM

## 2024-01-02 DIAGNOSIS — M6281 Muscle weakness (generalized): Secondary | ICD-10-CM

## 2024-01-02 DIAGNOSIS — R262 Difficulty in walking, not elsewhere classified: Secondary | ICD-10-CM

## 2024-01-02 NOTE — Therapy (Signed)
 OUTPATIENT PHYSICAL THERAPY LOWER EXTREMITY TREATMENT   Patient Name: Nesta Kimple MRN: [id number] DOB:1950/08/20, 73 y.o., male Today's Date: 01/02/2024  END OF SESSION:  PT End of Session - 01/02/24 1016     Visit Number 2    Number of Visits 16    Date for PT Re-Evaluation 03/12/24    PT Start Time 1017    PT Stop Time 1057    PT Time Calculation (min) 40 min    Activity Tolerance Patient tolerated treatment well    Behavior During Therapy WFL for tasks assessed/performed              Past Medical History:  Diagnosis Date   Diabetes mellitus without complication (HCC)    Hyperlipidemia    History reviewed. No pertinent surgical history. 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   Allergic rhinitis 09/21/2021   Glaucoma 09/21/2021   Knee pain 03/16/2021   Hypertension associated with diabetes (HCC) 02/23/2021   Subclinical hypothyroidism 11/02/2020   History of Helicobacter infection 12/12/2018   Intestinal metaplasia of gastric mucosa 12/12/2018   Gastritis 12/12/2018   History of colonic polyps 09/04/2018   Gastroesophageal reflux disease 09/04/2018   Fatty liver 09/04/2018   Abnormal LFTs 09/04/2018   Gallstones 09/04/2018   Biliary colic 07/24/2018   Type 2 diabetes mellitus without complication, without long-term current use of insulin (HCC) 07/10/2017   Hyperlipidemia associated with type 2 diabetes mellitus (HCC) 07/10/2017   Hyperbilirubinemia 07/10/2017   Swelling of lower extremity 07/10/2017    PCP: Rodney Clamp, MD  REFERRING PROVIDER: Syliva Even, MD  REFERRING DIAG: [phone] (ICD-10-CM) - Chronic pain of right knee M17.11 (ICD-10-CM) - Primary osteoarthritis of right knee  THERAPY DIAG:  Chronic pain of right knee  Muscle weakness (generalized)  Difficulty in walking, not elsewhere classified  Rationale for Evaluation and Treatment: Rehabilitation  ONSET DATE: a  while   SUBJECTIVE:   SUBJECTIVE STATEMENT: Video interpreter services used Dwain Giovanni. Reports he has been doing his exercises feels his knee is a little better.     Eval: Patient reports pain has been there a while but recently increased. Received injection from MD in right knee that seemed to help. MD notes reduced strength in legs and wants him to work out. It is painful to workout so he doesn't. States when he walks he also has pain in the back of his legs after 10-15 minutes. Previously he could walk a lot longer without pain.   Uses Voltaren  gel but no other medications. Also feels like his right leg is longer and he trips over it with walking.    PERTINENT HISTORY: Right knee pain, DB, cervical disc issues PAIN:  Are you having pain? Yes: NPRS scale: 3/10 Pain location: right knee  Pain description: NA Aggravating factors: movement Relieving factors: rest  PRECAUTIONS: None  RED FLAGS: None   WEIGHT BEARING RESTRICTIONS: No  FALLS:  Has patient fallen in last 6 months? No    PLOF: Independent  PATIENT GOALS: to be able to walk more     OBJECTIVE:  Note: Objective measures were completed at Evaluation unless otherwise noted.  DIAGNOSTIC FINDINGS:   Right knee xray 11/20/23 FINDINGS: No evidence of fracture, dislocation, or joint effusion. No evidence of arthropathy or other focal bone abnormality. Soft tissues are unremarkable.   IMPRESSION: Negative.  PATIENT SURVEYS:  Patient-specific activity functional scoring scheme (Point to one number):  0  represents "unable to perform." 10 represents "able to perform at prior level. 0 1 2 3 4 5 6 7 8 9  10 (Date and Score) Activity Initial  Activity Eval     Walking > 15 minutes without pain  0    Squatting 5    stairs 5    Additional Additional Total score = sum of the activity scores/number of activities Minimum detectable change (90%CI) for average score = 2 points Minimum detectable change (90%CI)  for single activity score = 3 points PSFS developed by: [place] Spitz., & Binkley, J. (1995). Assessing disability and change on individual  patients: a report of a patient specific measure. Physiotherapy [place], 47, [phone]. Reproduced with the permission of the authors  Score: 10/3=3.33   COGNITION: Overall cognitive status: Within functional limits for tasks assessed     SENSATION: Not tested  EDEMA:  None noted    POSTURE: decreased lumbar lordosis  PALPATION: No tenderness to palpation noted in hips, back or right lower extremity    LE Measurements Lower Extremity Right EVAL Left EVAL   A/PROM MMT A/PROM MMT  Hip Flexion  3+  3+  Hip Extension 5 3+ 5 3+  Hip Abduction      Hip Adduction      Hip Internal rotation 20  30   Hip External rotation 45  45   Knee Flexion 120* 4- 130 4-  Knee Extension 0 4- 0 4  Ankle Dorsiflexion  4+  4+  Ankle Plantarflexion      Ankle Inversion      Ankle Eversion       (Blank rows = not tested) * pain   LOWER EXTREMITY SPECIAL TESTS:  Leg length test: Right longer with supine to sit test Negative Ely's bilaterally   GAIT: Distance walked: 25 feet within clinic Assistive device utilized: None Level of assistance: Complete Independence Comments: Wide gait limited hip range of motion                                                                                                                                TREATMENT DATE:   01/02/2024  Therapeutic Exercise:  Aerobic: Supine: thomas stretch x3 30 holds B, SLR 2x15 5 holds B, hip add iso 2 minutes, hip abd red band  2 minutes--> then iso with belt 2 minutes   Bridge x 15 5 seconds, leg lower to 45 degrees 3 x 5 Prone:  Seated:  Standing: Neuromuscular Re-education: Manual Therapy: Therapeutic Activity: Self Care: Trigger Point Dry Needling:  Modalities:    PATIENT EDUCATION:  Education details: on current presentation, on HEP, on  clinical outcomes score and POC Person educated: Patient Education method: Explanation, Demonstration, and Handouts Education comprehension: verbalized understanding   HOME EXERCISE PROGRAM: [id number] Leg lower for core  ASSESSMENT:  CLINICAL IMPRESSION: 01/02/2024 Continued to progress exercises as able. Challenging due to initial difficulties obtaining translator but then  translator became available.Added new exercises to HEP. Reviewed and answered all questions about current exercises. Less pain reported end of session. Will continue with current POC as tolerated.   Eval: Patient presents to physical therapy with complaints of right knee pain. Interpreter services used but at times connectivity was in and out which made for mild difficulties in explaining exercises and presentation with patient. Will revisit education of findings next session to make sure patient understands. Patient presents with pain, ROM and strength deficits that are likely contributing to current presentation and would greatly benefit from skilled PT to improve overall function at home and in the community.    OBJECTIVE IMPAIRMENTS: decreased activity tolerance, decreased mobility, difficulty walking, decreased ROM, decreased strength, and pain.   ACTIVITY LIMITATIONS: stairs, transfers, and locomotion level  PARTICIPATION LIMITATIONS: community activity  PERSONAL FACTORS: Age, Fitness, and Time since onset of injury/illness/exacerbation are also affecting patient's functional outcome.   REHAB POTENTIAL: Good  CLINICAL DECISION MAKING: Stable/uncomplicated  EVALUATION COMPLEXITY: Low   GOALS: Goals reviewed with patient? yes  SHORT TERM GOALS: Target date: 01/30/2024   Patient will be independent in self management strategies to improve quality of life and functional outcomes. Baseline: New Program Goal status: INITIAL  2.  Patient will report at least 50% improvement in overall symptoms and/or function  to demonstrate improved functional mobility Baseline: 0% better Goal status: INITIAL  3.  Patient will be able to demonstrate at least 4/5 MMT in B LE Baseline:see above   Goal status: INITIAL       LONG TERM GOALS: Target date: 03/12/2024    Patient will report at least 75% improvement in overall symptoms and/or function to demonstrate improved functional mobility Baseline: 0% better Goal status: INITIAL  2.  Patient will score at least 2 points higher on PSFS average to demonstrate change in overall function. Baseline: see above Goal status: INITIAL  3.  Patient will demonstrate full pain free right knee ROM  Baseline: painful Goal status: INITIAL  4.  Patient will be able to walk 30 minutes without pain to improve community ambulation  Baseline: 10-15 minutes without pain  Goal status: INITIAL    PLAN:  PT FREQUENCY: 1-2x/week for a total of 16 visits over 12 week certification period  PT DURATION: 12 weeks  PLANNED INTERVENTIONS: 97110-Therapeutic exercises, 97530- Therapeutic activity, 97112- Neuromuscular re-education, 97535- Self Care, 13244- Manual therapy, [phone]- Gait training, [phone]- Orthotic Fit/training, [phone]- Canalith repositioning, [id number]- Aquatic Therapy, 97014- Electrical stimulation (unattended), [phone]- Ionotophoresis 4mg /ml Dexamethasone, Patient/Family education, Balance training, Stair training, Taping, Dry Needling, Joint mobilization, Joint manipulation, Spinal manipulation, Spinal mobilization, Cryotherapy, and Moist heat   PLAN FOR NEXT SESSION: LE and core strengthening, thigh stretches    11:01 AM, 01/02/24 Tabitha Ewings, DPT Physical Therapy with [facility]

## 2024-01-09 ENCOUNTER — Encounter: Payer: Self-pay | Admitting: Physical Therapy

## 2024-01-09 ENCOUNTER — Ambulatory Visit: Admitting: Physical Therapy

## 2024-01-09 DIAGNOSIS — M25561 Pain in right knee: Secondary | ICD-10-CM | POA: Diagnosis not present

## 2024-01-09 DIAGNOSIS — M6281 Muscle weakness (generalized): Secondary | ICD-10-CM | POA: Diagnosis not present

## 2024-01-09 DIAGNOSIS — R262 Difficulty in walking, not elsewhere classified: Secondary | ICD-10-CM | POA: Diagnosis not present

## 2024-01-09 DIAGNOSIS — G8929 Other chronic pain: Secondary | ICD-10-CM

## 2024-01-09 NOTE — Therapy (Signed)
 OUTPATIENT PHYSICAL THERAPY LOWER EXTREMITY TREATMENT   Patient Name: Richard Reeves MRN: [id number] DOB:[birth year], 74 y.o., male Today's Date: 01/09/2024  END OF SESSION:  PT End of Session - 01/09/24 1213     Visit Number 3    Number of Visits 16    Date for PT Re-Evaluation 03/12/24    PT Start Time 1215    PT Stop Time 1300    PT Time Calculation (min) 45 min    Activity Tolerance Patient tolerated treatment well    Behavior During Therapy WFL for tasks assessed/performed              Past Medical History:  Diagnosis Date   Diabetes mellitus without complication (HCC)    Hyperlipidemia    History reviewed. No pertinent surgical history. 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   Allergic rhinitis 09/21/2021   Glaucoma 09/21/2021   Knee pain 03/16/2021   Hypertension associated with diabetes (HCC) 02/23/2021   Subclinical hypothyroidism 11/02/2020   History of Helicobacter infection 12/12/2018   Intestinal metaplasia of gastric mucosa 12/12/2018   Gastritis 12/12/2018   History of colonic polyps 09/04/2018   Gastroesophageal reflux disease 09/04/2018   Fatty liver 09/04/2018   Abnormal LFTs 09/04/2018   Gallstones 09/04/2018   Biliary colic 07/24/2018   Type 2 diabetes mellitus without complication, without long-term current use of insulin (HCC) 07/10/2017   Hyperlipidemia associated with type 2 diabetes mellitus (HCC) 07/10/2017   Hyperbilirubinemia 07/10/2017   Swelling of lower extremity 07/10/2017    PCP: Kennyth Worth HERO, MD  REFERRING PROVIDER: Joane Artist RAMAN, MD  REFERRING DIAG: [phone] (ICD-10-CM) - Chronic pain of right knee M17.11 (ICD-10-CM) - Primary osteoarthritis of right knee  THERAPY DIAG:  Chronic pain of right knee  Muscle weakness (generalized)  Difficulty in walking, not elsewhere classified  Rationale for Evaluation and Treatment: Rehabilitation  ONSET DATE: a  while   SUBJECTIVE:   SUBJECTIVE STATEMENT: Video interpreter services used - Jennie. Both knees are bothering him today. This started  yesterday and it started without MOI. Left knee feels numbness   Right knee is good some days and bad some days. Doesn't feel a difference with the exercises.     Eval: Patient reports pain has been there a while but recently increased. Received injection from MD in right knee that seemed to help. MD notes reduced strength in legs and wants him to work out. It is painful to workout so he doesn't. States when he walks he also has pain in the back of his legs after 10-15 minutes. Previously he could walk a lot longer without pain.   Uses Voltaren  gel but no other medications. Also feels like his right leg is longer and he trips over it with walking.    PERTINENT HISTORY: Right knee pain, DB, cervical disc issues PAIN:  Are you having pain? Yes: NPRS scale: 4/10 Pain location: right knee  Pain description: NA Aggravating factors: movement Relieving factors: rest  PRECAUTIONS: None  RED FLAGS: None   WEIGHT BEARING RESTRICTIONS: No  FALLS:  Has patient fallen in last 6 months? No    PLOF: Independent  PATIENT GOALS: to be able to walk more     OBJECTIVE:  Note: Objective measures were completed at Evaluation unless otherwise noted.  DIAGNOSTIC FINDINGS:   Right knee xray 11/20/23 FINDINGS: No evidence of fracture, dislocation, or joint effusion. No evidence of arthropathy or other focal bone  abnormality. Soft tissues are unremarkable.   IMPRESSION: Negative.  PATIENT SURVEYS:  Patient-specific activity functional scoring scheme (Point to one number):  0 represents "unable to perform." 10 represents "able to perform at prior level. 0 1 2 3 4 5 6 7 8 9  10 (Date and Score) Activity Initial  Activity Eval     Walking > 15 minutes without pain  0    Squatting 5    stairs 5    Additional Additional Total score = sum of the  activity scores/number of activities Minimum detectable change (90%CI) for average score = 2 points Minimum detectable change (90%CI) for single activity score = 3 points PSFS developed by: Rosalee MYRTIS Marvis KYM Charlet CHRISTELLA., & Binkley, J. (1995). Assessing disability and change on individual  patients: a report of a patient specific measure. Physiotherapy [place], 47, [phone]. Reproduced with the permission of the authors  Score: 10/3=3.33   COGNITION: Overall cognitive status: Within functional limits for tasks assessed     SENSATION: Not tested  EDEMA:  None noted    POSTURE: decreased lumbar lordosis  PALPATION: No tenderness to palpation noted in hips, back or right lower extremity    LE Measurements Lower Extremity Right EVAL Left EVAL   A/PROM MMT A/PROM MMT  Hip Flexion  3+  3+  Hip Extension 5 3+ 5 3+  Hip Abduction      Hip Adduction      Hip Internal rotation 20  30   Hip External rotation 45  45   Knee Flexion 120* 4- 130 4-  Knee Extension 0 4- 0 4  Ankle Dorsiflexion  4+  4+  Ankle Plantarflexion      Ankle Inversion      Ankle Eversion       (Blank rows = not tested) * pain   LOWER EXTREMITY SPECIAL TESTS:  Leg length test: Right longer with supine to sit test Negative Ely's bilaterally   GAIT: Distance walked: 25 feet within clinic Assistive device utilized: None Level of assistance: Complete Independence Comments: Wide gait limited hip range of motion                                                                                                                                TREATMENT DATE:   01/09/2024  Therapeutic Exercise: On rationale behind interventions and goals for different things how to progress exercises as needed for increased intensity.  Supine:  Kneeling: reverse nordic verbal and tactile cues 4x5 slow and controlled  Prone: press ups x10 10 holds, hamstring curls x5 B 5 holds   Seated: STS with 2 8# weights -  instructed to use gallon jugs at home for weights 5x5. Slow and controlled  Standing: Neuromuscular Re-education: Manual Therapy: percussion gun for vibration in right knee - no change. No TP noted in right calf Therapeutic Activity: Self Care: Trigger Point Dry Needling:  Modalities:    PATIENT EDUCATION:  Education details: on HEP  Person educated: Patient Education method: Explanation, Demonstration, and Handouts Education comprehension: verbalized understanding   HOME EXERCISE PROGRAM: [id number] Leg lower for core  ASSESSMENT:  CLINICAL IMPRESSION: 01/09/2024 Session focused on education. Patient originally complained of increased  knee pain today in both legs with numbness in left knee. Ruled out lumbar spine and calf. Patient then reporting mid session that he didn't have any current pain and wants more challenging exercises as his exercises are easy. Explained rationale for assessment of back and calf with recent report of increase in knee symptoms. Added reverse nordic curls and squats with weights. Discussed using water jugs to mimic weights as patient does not have weights at home.   Eval: Patient presents to physical therapy with complaints of right knee pain. Interpreter services used but at times connectivity was in and out which made for mild difficulties in explaining exercises and presentation with patient. Will revisit education of findings next session to make sure patient understands. Patient presents with pain, ROM and strength deficits that are likely contributing to current presentation and would greatly benefit from skilled PT to improve overall function at home and in the community.    OBJECTIVE IMPAIRMENTS: decreased activity tolerance, decreased mobility, difficulty walking, decreased ROM, decreased strength, and pain.   ACTIVITY LIMITATIONS: stairs, transfers, and locomotion level  PARTICIPATION LIMITATIONS: community activity  PERSONAL FACTORS: Age,  Fitness, and Time since onset of injury/illness/exacerbation are also affecting patient's functional outcome.   REHAB POTENTIAL: Good  CLINICAL DECISION MAKING: Stable/uncomplicated  EVALUATION COMPLEXITY: Low   GOALS: Goals reviewed with patient? yes  SHORT TERM GOALS: Target date: 01/30/2024   Patient will be independent in self management strategies to improve quality of life and functional outcomes. Baseline: New Program Goal status: INITIAL  2.  Patient will report at least 50% improvement in overall symptoms and/or function to demonstrate improved functional mobility Baseline: 0% better Goal status: INITIAL  3.  Patient will be able to demonstrate at least 4/5 MMT in B LE Baseline:see above   Goal status: INITIAL       LONG TERM GOALS: Target date: 03/12/2024    Patient will report at least 75% improvement in overall symptoms and/or function to demonstrate improved functional mobility Baseline: 0% better Goal status: INITIAL  2.  Patient will score at least 2 points higher on PSFS average to demonstrate change in overall function. Baseline: see above Goal status: INITIAL  3.  Patient will demonstrate full pain free right knee ROM  Baseline: painful Goal status: INITIAL  4.  Patient will be able to walk 30 minutes without pain to improve community ambulation  Baseline: 10-15 minutes without pain  Goal status: INITIAL    PLAN:  PT FREQUENCY: 1-2x/week for a total of 16 visits over 12 week certification period  PT DURATION: 12 weeks  PLANNED INTERVENTIONS: 97110-Therapeutic exercises, 97530- Therapeutic activity, 97112- Neuromuscular re-education, 97535- Self Care, 02859- Manual therapy, [phone]- Gait training, [phone]- Orthotic Fit/training, [phone]- Canalith repositioning, [id number]- Aquatic Therapy, [id number]- Electrical stimulation (unattended), [phone]- Ionotophoresis 4mg /ml Dexamethasone, Patient/Family education, Balance training, Stair training, Taping, Dry Needling,  Joint mobilization, Joint manipulation, Spinal manipulation, Spinal mobilization, Cryotherapy, and Moist heat   PLAN FOR NEXT SESSION: add weights, focus on more challenging LE strengthening exercises   1:17 PM, 01/09/24 Olivia Church, DPT Physical Therapy with [facility]

## 2024-01-17 ENCOUNTER — Encounter: Admitting: Physical Therapy

## 2024-01-25 ENCOUNTER — Telehealth: Payer: Self-pay

## 2024-01-25 ENCOUNTER — Encounter: Admitting: Physical Therapy

## 2024-01-25 DIAGNOSIS — M1711 Unilateral primary osteoarthritis, right knee: Secondary | ICD-10-CM

## 2024-01-25 DIAGNOSIS — G8929 Other chronic pain: Secondary | ICD-10-CM

## 2024-01-25 NOTE — Telephone Encounter (Signed)
 Order placed

## 2024-01-25 NOTE — Addendum Note (Signed)
 Addended by: MARDY LEOTIS RAMAN on: 01/25/2024 08:34 AM   Modules accepted: Orders

## 2024-01-25 NOTE — Telephone Encounter (Signed)
 Needs MRI R knee

## 2024-02-01 ENCOUNTER — Encounter: Admitting: Physical Therapy

## 2024-02-08 ENCOUNTER — Encounter: Admitting: Physical Therapy

## 2024-02-12 ENCOUNTER — Ambulatory Visit

## 2024-02-12 DIAGNOSIS — G8929 Other chronic pain: Secondary | ICD-10-CM

## 2024-02-12 DIAGNOSIS — M1711 Unilateral primary osteoarthritis, right knee: Secondary | ICD-10-CM

## 2024-02-12 DIAGNOSIS — S83231A Complex tear of medial meniscus, current injury, right knee, initial encounter: Secondary | ICD-10-CM | POA: Diagnosis not present

## 2024-02-12 DIAGNOSIS — M94261 Chondromalacia, right knee: Secondary | ICD-10-CM | POA: Diagnosis not present

## 2024-02-12 DIAGNOSIS — M25461 Effusion, right knee: Secondary | ICD-10-CM | POA: Diagnosis not present

## 2024-02-12 DIAGNOSIS — M25561 Pain in right knee: Secondary | ICD-10-CM

## 2024-02-13 ENCOUNTER — Telehealth: Payer: Self-pay | Admitting: Family Medicine

## 2024-02-13 ENCOUNTER — Ambulatory Visit: Payer: Self-pay | Admitting: Family Medicine

## 2024-02-13 NOTE — Telephone Encounter (Signed)
  Encourage patient to contact the pharmacy for refills or they can request refills through [facility]  LAST APPOINTMENT DATE:  Please schedule appointment if longer than 1 year  NEXT APPOINTMENT DATE:  MEDICATION:   metFORMIN  (GLUCOPHAGE ) 1000 MG tablet   Disp Refills Start End   losartan  (COZAAR ) 50 MG tablet       pravastatin  (PRAVACHOL ) 20 MG tablet   Is the patient out of medication? Almost  PHARMACY:  [id number] - [place], [place] - [address] Phone: [phone] Fax: [phone]    Let patient know to contact pharmacy at the end of the day to make sure medication is ready.  Please notify patient to allow 48-72 hours to process

## 2024-02-13 NOTE — Telephone Encounter (Signed)
 Patient notified contact pharmacy for refills still have refills left on all prescription  Verbalized understanding

## 2024-02-13 NOTE — Progress Notes (Signed)
 Knee MRI shows arthritis and a degenerative meniscus tear and bursitis.  We could try different injection into the bursa of the knee which may help.  Recommend scheduling appointment with myself or my colleagues in the near future.

## 2024-02-29 NOTE — Progress Notes (Signed)
 Ben Bernadetta Roell D.[facility]MENTEEN [facility] [address] [place] [address] Phone: [phone]   Assessment and Plan:     1. Primary osteoarthritis of right knee (Primary) 2. Chronic pain of right knee -Chronic with exacerbation, subsequent visit - Continued right knee pain most consistent with moderate osteoarthritis and medial meniscal tear as seen on MRI - Reviewed MRI right knee from 02/12/2024 with patient.  Discussed moderate degenerative changes primarily medial compartment with complex tear posterior horn and anterior horn medial meniscus. - Start meloxicam 15 mg daily x2 weeks.  If still having pain after 2 weeks, complete 3rd-week of NSAID. May use remaining NSAID as needed once daily for pain control.  Do not to use additional over-the-counter NSAIDs (ibuprofen, naproxen, Advil, Aleve, etc.) while taking prescription NSAIDs.  May use Tylenol [phone] mg 2 to 3 times a day for breakthrough pain. -Start HEP and restart physical therapy - CSI to intra-articular right knee provided mild temporary relief for patient in the past.  Last CSI 11/20/2023  3. Left knee pain, acute chronicity -Acute, initial visit - Most consistent with flare of left knee pain likely due to compensation with ongoing right knee pain - Start meloxicam 15 mg daily x2 weeks.  If still having pain after 2 weeks, complete 3rd-week of NSAID. May use remaining NSAID as needed once daily for pain control.  Do not to use additional over-the-counter NSAIDs (ibuprofen, naproxen, Advil, Aleve, etc.) while taking prescription NSAIDs.  May use Tylenol [phone] mg 2 to 3 times a day for breakthrough pain. - Start HEP and physical therapy for knee  15 additional minutes spent for educating Therapeutic Home Exercise Program.  This included exercises focusing on stretching, strengthening, with focus on eccentric aspects.   Long term goals include an improvement in range of motion, strength, endurance as  well as avoiding reinjury. Patient's frequency would include in 1-2 times a day, 3-5 times a week for a duration of 6-12 weeks. Proper technique shown and discussed handout in great detail with ATC.  All questions were discussed and answered.    4.  Right knee mass - New finding - On MRI review, there is a posterior tibial mass seen on MRI that was not mentioned in original dictation.  Called and spoke with radiology.  On their review, they discussed that there were not concerning features, and mass was likely benign.  Could reflect complex ganglion cystic structure based on its proximity to PCL.  Could represent benign bony mass.  Radiology report will be updated.  No further workup required at this time  Patient accompanied by his wife throughout entirety of office visit.  Digital interpreter used throughout entirety of office visit.   Pertinent previous records reviewed include r knee mri  02/12/24  Time of visit 48 minutes, which included chart review, physical exam, treatment plan being performed, interpreted, and discussed with patient at today's visit.   Follow Up: 5 weeks for reevaluation.  Could discuss alternative injections versus orthopedic surgery referral    Subjective:   I, Moenique Parris, am serving as a Neurosurgeon for Doctor Morene Mace  Chief Complaint: right knee pain   HPI:  11/20/2023 Valdemar Mcclenahan is a 73 y.o. male who presents to [facility] today for R knee pain. Pt was last seen by Dr. Joane on 06/27/23 and was given a repeat R knee steroid injection and advised to cont quad strengthening. Avoid high-dose oral NSAIDs.    Today,  pt reports exacerbation of right knee pain over the past month. Denies swelling. Denies mechanical sx. Locates pain distal to patella. Denies new injury. Has not been working on quad strengthening regularly d/t knee pain. Pain worse with knee flexion, squatting. Has not taken any meds for this exacerbation.    Dx  imaging: 03/17/22 R knee XR 01/20/21 R knee XR    Pertinent review of systems: No fevers or chills   Relevant historical information: Hypertension and diabetes     Exam:  BP 130/82   Pulse 77   Ht 5' 9 (1.753 m)   Wt 161 lb (73 kg)   SpO2 96%   BMI 23.78 kg/m  General: Well Developed, well nourished, and in no acute distress.    MSK: Right knee decreased quadricep bulk.  Normal knee motion.  03/01/2024 Patient states bilateral knee pain   Relevant Historical Information: HTN, DM II  Additional pertinent review of systems negative.   Current Outpatient Medications:    meloxicam (MOBIC) 15 MG tablet, Take 1 tablet (15 mg total) by mouth daily., Disp: 30 tablet, Rfl: 0   amoxicillin-clavulanate (AUGMENTIN) 875-125 MG tablet, Take 1 tablet by mouth 2 (two) times daily., Disp: 14 tablet, Rfl: 0   aspirin EC 81 MG tablet, Take 81 mg by mouth daily., Disp: , Rfl:    benzonatate (TESSALON PERLES) 100 MG capsule, Take 1 capsule (100 mg total) by mouth 3 (three) times daily as needed., Disp: 30 capsule, Rfl: 1   blood glucose meter kit and supplies KIT, Dispense based on patient and insurance preference. Use daily as needed to check blood sugar., Disp: 1 each, Rfl: 0   losartan (COZAAR) 50 MG tablet, Take 1 tablet (50 mg total) by mouth daily., Disp: 90 tablet, Rfl: 3   metFORMIN (GLUCOPHAGE) 1000 MG tablet, Take 1 tablet (1,000 mg total) by mouth 2 (two) times daily with a meal., Disp: 180 tablet, Rfl: 3   pravastatin (PRAVACHOL) 20 MG tablet, Take 1 tablet (20 mg total) by mouth daily., Disp: 90 tablet, Rfl: 3   Objective:     Vitals:   03/01/24 0805  Pulse: 66  SpO2: 98%  Weight: 161 lb (73 kg)  Height: 5' 9 (1.753 m)      Body mass index is 23.78 kg/m.    Physical Exam:    General:  awake, alert oriented, no acute distress nontoxic Skin: no suspicious lesions or rashes Neuro:sensation intact and strength 5/5 with no deficits, no atrophy, normal muscle tone Psych: No  signs of anxiety, depression or other mood disorder  bilateral Knee: No swelling No deformity Neg fluid wave, joint milking ROM Flex 110, Ext 0 TTP medial and lateral joint line NTTP over the quad tendon,  patella, plica, patella tendon, tibial tuberostiy, fibular head, posterior fossa, pes anserine bursa, gerdy's tubercle,    Gait normal    Electronically signed by:  Odis Mace D.[facility] 10:16 AM 03/01/24

## 2024-03-01 ENCOUNTER — Ambulatory Visit: Admitting: Sports Medicine

## 2024-03-01 VITALS — HR 66 | Ht 69.0 in | Wt 161.0 lb

## 2024-03-01 DIAGNOSIS — M25562 Pain in left knee: Secondary | ICD-10-CM | POA: Diagnosis not present

## 2024-03-01 DIAGNOSIS — M1711 Unilateral primary osteoarthritis, right knee: Secondary | ICD-10-CM

## 2024-03-01 DIAGNOSIS — M25561 Pain in right knee: Secondary | ICD-10-CM

## 2024-03-01 DIAGNOSIS — G8929 Other chronic pain: Secondary | ICD-10-CM | POA: Diagnosis not present

## 2024-03-01 DIAGNOSIS — R2241 Localized swelling, mass and lump, right lower limb: Secondary | ICD-10-CM

## 2024-03-01 MED ORDER — MELOXICAM 15 MG PO TABS: 15.0000 mg | ORAL_TABLET | Freq: Every day | ORAL | 0 refills | Status: DC

## 2024-03-01 NOTE — Patient Instructions (Signed)
-   Start meloxicam 15 mg daily x2 weeks.  If still having pain after 2 weeks, complete 3rd-week of NSAID. May use remaining NSAID as needed once daily for pain control.  Do not to use additional over-the-counter NSAIDs (ibuprofen, naproxen, Advil, Aleve, etc.) while taking prescription NSAIDs.  May use Tylenol [phone] mg 2 to 3 times a day for breakthrough pain.  Knee HEP   PT referral   Recommend stationary bike, elliptical, and water aerobics   5 week follow up   Sign report for daughter to see medical records

## 2024-03-12 ENCOUNTER — Encounter: Payer: Self-pay | Admitting: Family Medicine

## 2024-03-12 ENCOUNTER — Ambulatory Visit: Payer: Medicare HMO | Admitting: Family Medicine

## 2024-03-12 VITALS — BP 118/70 | HR 62 | Temp 98.1°F | Ht 69.0 in | Wt 161.2 lb

## 2024-03-12 DIAGNOSIS — K219 Gastro-esophageal reflux disease without esophagitis: Secondary | ICD-10-CM

## 2024-03-12 DIAGNOSIS — R1013 Epigastric pain: Secondary | ICD-10-CM | POA: Diagnosis not present

## 2024-03-12 DIAGNOSIS — E038 Other specified hypothyroidism: Secondary | ICD-10-CM | POA: Diagnosis not present

## 2024-03-12 DIAGNOSIS — E119 Type 2 diabetes mellitus without complications: Secondary | ICD-10-CM

## 2024-03-12 DIAGNOSIS — E785 Hyperlipidemia, unspecified: Secondary | ICD-10-CM

## 2024-03-12 DIAGNOSIS — E1159 Type 2 diabetes mellitus with other circulatory complications: Secondary | ICD-10-CM | POA: Diagnosis not present

## 2024-03-12 DIAGNOSIS — E1169 Type 2 diabetes mellitus with other specified complication: Secondary | ICD-10-CM | POA: Diagnosis not present

## 2024-03-12 DIAGNOSIS — I152 Hypertension secondary to endocrine disorders: Secondary | ICD-10-CM

## 2024-03-12 DIAGNOSIS — Z125 Encounter for screening for malignant neoplasm of prostate: Secondary | ICD-10-CM

## 2024-03-12 DIAGNOSIS — Z0001 Encounter for general adult medical examination with abnormal findings: Secondary | ICD-10-CM

## 2024-03-12 DIAGNOSIS — Z7984 Long term (current) use of oral hypoglycemic drugs: Secondary | ICD-10-CM

## 2024-03-12 DIAGNOSIS — K2950 Unspecified chronic gastritis without bleeding: Secondary | ICD-10-CM

## 2024-03-12 LAB — LIPID PANEL
Cholesterol: 157 mg/dL (ref 0–200)
HDL: 50.9 mg/dL (ref >39.00)
LDL Cholesterol: 68 mg/dL (ref 0–99)
NonHDL: 106.44
Total CHOL/HDL Ratio: 3
Triglycerides: 193 mg/dL — ABNORMAL HIGH (ref 0.0–149.0)
VLDL: 38.6 mg/dL (ref 0.0–40.0)

## 2024-03-12 LAB — URINALYSIS, ROUTINE W REFLEX MICROSCOPIC
Bilirubin Urine: NEGATIVE
Ketones, ur: NEGATIVE
Leukocytes,Ua: NEGATIVE
Nitrite: NEGATIVE
Specific Gravity, Urine: 1.02 (ref 1.000–1.030)
Total Protein, Urine: NEGATIVE
Urine Glucose: NEGATIVE
Urobilinogen, UA: 0.2 (ref 0.0–1.0)
pH: 6 (ref 5.0–8.0)

## 2024-03-12 LAB — COMPREHENSIVE METABOLIC PANEL WITH GFR
ALT: 16 U/L (ref 0–53)
AST: 25 U/L (ref 0–37)
Albumin: 4.7 g/dL (ref 3.5–5.2)
Alkaline Phosphatase: 61 U/L (ref 39–117)
BUN: 18 mg/dL (ref 6–23)
CO2: 27 meq/L (ref 19–32)
Calcium: 9.5 mg/dL (ref 8.4–10.5)
Chloride: 103 meq/L (ref 96–112)
Creatinine, Ser: 1.06 mg/dL (ref 0.40–1.50)
GFR: 70.97 mL/min (ref >60.00)
Glucose, Bld: 117 mg/dL — ABNORMAL HIGH (ref 70–99)
Potassium: 4.4 meq/L (ref 3.5–5.1)
Sodium: 140 meq/L (ref 135–145)
Total Bilirubin: 2.2 mg/dL — ABNORMAL HIGH (ref 0.2–1.2)
Total Protein: 7.2 g/dL (ref 6.0–8.3)

## 2024-03-12 LAB — CBC
HCT: 39.6 % (ref 39.0–52.0)
Hemoglobin: 13.2 g/dL (ref 13.0–17.0)
MCHC: 33.3 g/dL (ref 30.0–36.0)
MCV: 92.4 fl (ref 78.0–100.0)
Platelets: 196 K/uL (ref 150.0–400.0)
RBC: 4.29 Mil/uL (ref 4.22–5.81)
RDW: 13.5 % (ref 11.5–15.5)
WBC: 4.2 K/uL (ref 4.0–10.5)

## 2024-03-12 LAB — TSH: TSH: 6.48 u[IU]/mL — ABNORMAL HIGH (ref 0.35–5.50)

## 2024-03-12 LAB — LIPASE: Lipase: 43 U/L (ref 11.0–59.0)

## 2024-03-12 LAB — HEMOGLOBIN A1C: Hgb A1c MFr Bld: 8 % — ABNORMAL HIGH (ref 4.6–6.5)

## 2024-03-12 LAB — PSA: PSA: 1.12 ng/mL (ref 0.10–4.00)

## 2024-03-12 MED ORDER — BLOOD GLUCOSE MONITORING SUPPL DEVI: 1.0000 | Freq: Three times a day (TID) | 0 refills | Status: AC

## 2024-03-12 MED ORDER — BLOOD GLUCOSE TEST VI STRP: 1.0000 | ORAL_STRIP | Freq: Three times a day (TID) | 0 refills | Status: AC

## 2024-03-12 MED ORDER — LANCET DEVICE MISC: 1.0000 | Freq: Three times a day (TID) | 0 refills | Status: AC

## 2024-03-12 MED ORDER — LANCETS MISC. MISC: 1.0000 | Freq: Three times a day (TID) | 0 refills | Status: AC

## 2024-03-12 NOTE — Assessment & Plan Note (Signed)
 Not currently on any PPI.  May be contributing to above epigastric pain.  We discussed restarting PPI however he declined.  Also discussed follow-up GI referral as above however he declined.

## 2024-03-12 NOTE — Progress Notes (Signed)
 Chief Complaint:  Richard Reeves is a 73 y.o. male who presents today for his annual comprehensive physical exam.    Assessment/Plan:  New/Acute Problems: Epigastric Abdominal Pain Patient with mild recurrent epigastric pain for the last several years but worsened recently. Normal exam today. He did have a GI work up for this several years ago including EGD. They had recommended a 6 month follow up EGD due to finding of intestinal metaplasia. Unfortunately this was not scheduled. He apparently had an EGD and colonoscopy in [place] last year which was normal. We did discuss having him go back to gastroenterology for further assessment a however he declined. We did also discuss his previous ultrasound findings of cholelithiasis. Discussed referral to surgery however he declined. He would like to have his lipase checked today. We will order requested labs today. He will let us  know if he changes his mind about referrals or if he develops any new or worsening symptoms.   Chronic Problems Addressed Today: Type 2 diabetes mellitus without complication, without long-term current use of insulin (HCC) Check A1c.  On metformin  1000 mg twice daily.  Discussed lifestyle modifications.  Hyperlipidemia associated with type 2 diabetes mellitus (HCC) On pravastatin  20 mg daily.  Check labs.   Subclinical hypothyroidism Check TSH.  Hypertension associated with diabetes (HCC) At goal today on losartan  50 mg daily.  Gastroesophageal reflux disease Not currently on any PPI.  May be contributing to above epigastric pain.  We discussed restarting PPI however he declined.  Also discussed follow-up GI referral as above however he declined.  Preventative Healthcare: Check labs.  Due for colonoscopy in 2027.  Patient Counseling(The following topics were reviewed and/or handout was given):  -Nutrition: Stressed importance of moderation in sodium/caffeine intake, saturated fat and cholesterol, caloric balance, sufficient  intake of fresh fruits, vegetables, and fiber.  -Stressed the importance of regular exercise.   -Substance Abuse: Discussed cessation/primary prevention of tobacco, alcohol, or other drug use; driving or other dangerous activities under the influence; availability of treatment for abuse.   -Injury prevention: Discussed safety belts, safety helmets, smoke detector, smoking near bedding or upholstery.   -Sexuality: Discussed sexually transmitted diseases, partner selection, use of condoms, avoidance of unintended pregnancy and contraceptive alternatives.   -Dental health: Discussed importance of regular tooth brushing, flossing, and dental visits.  -Health maintenance and immunizations reviewed. Please refer to Health maintenance section.  Return to care in 1 year for next preventative visit.     Subjective:  HPI:  He has no acute complaints today. Patient is here today for his annual physical.  See assessment / plan for status of chronic conditions.  History provided by patient via [place] interpreter.  Discussed the use of AI scribe software for clinical note transcription with the patient, who gave verbal consent to proceed.  History of Present Illness Richard Reeves is a 73 year old male who presents for an annual physical exam and management of diabetes and joint pain.  He has not been testing his blood sugar levels recently due to a lack of test strips and requires a refill for his diabetes supplies, including test strips and lancets. He has no issues with his current medications and no side effects.  He has been experiencing knee pain and has been under the care of sports medicine specialists. Although there is some improvement, he is not fully satisfied with the progress. He engages in biking for exercise, approximately 15 minutes at a time, but experiences swelling afterward.  Regarding  his diet, he consumes meat but avoids dairy products.  He has a history of abdominal pain and gallstones,  identified a few years ago. He occasionally experiences abdominal pain described as a pinching sensation. He does not currently take acid blocker medication unless his stomach hurts. He recalls having an upper endoscopy a few years ago, which showed some inflammation in the stomach. He had a colonoscopy in 2020, during which one polyp was removed. He underwent an endoscopy in [place] last year, which was normal, but a colonoscopy revealed polyps that were removed.  Lifestyle Diet: Balanced. Plenty of fruits and vegetables.  Exercise: 15 minutes on bike per day.      03/12/2024    8:43 AM  Depression screen PHQ 2/9  Decreased Interest 0  Down, Depressed, Hopeless 0  PHQ - 2 Score 0    Health Maintenance Due  Topic Date Due   OPHTHALMOLOGY EXAM  Never done   FOOT EXAM  09/22/2022   Medicare Annual Wellness (AWV)  05/10/2023   HEMOGLOBIN A1C  03/11/2024     ROS: Per HPI, otherwise a complete review of systems was negative.   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   Allergic rhinitis 09/21/2021   Glaucoma 09/21/2021   Knee pain 03/16/2021   Hypertension associated with diabetes (HCC) 02/23/2021   Subclinical hypothyroidism 11/02/2020   History of Helicobacter infection 12/12/2018   Intestinal metaplasia of gastric mucosa 12/12/2018   Gastritis 12/12/2018   History of colonic polyps 09/04/2018   Gastroesophageal reflux disease 09/04/2018   Fatty liver 09/04/2018   Abnormal LFTs 09/04/2018   Gallstones 09/04/2018   Type 2 diabetes mellitus without complication, without long-term current use of insulin (HCC) 07/10/2017   Hyperlipidemia associated with type 2 diabetes mellitus (HCC) 07/10/2017   Hyperbilirubinemia 07/10/2017   Swelling of lower  extremity 07/10/2017   No past surgical history on file.  Family History  Problem Relation Age of Onset   Cancer Mother    Colon cancer Neg Hx    Esophageal cancer Neg Hx    Inflammatory bowel disease Neg Hx    Liver disease Neg Hx    Pancreatic cancer Neg Hx    Rectal cancer Neg Hx    Stomach cancer Neg Hx     Medications- reviewed and updated Current Outpatient Medications  Medication Sig Dispense Refill   aspirin EC 81 MG tablet Take 81 mg by mouth daily.     Blood Glucose Monitoring Suppl DEVI 1 each by Does not apply route in the morning, at noon, and at bedtime. May substitute to any manufacturer covered by patient's insurance. 1 each 0   Glucose Blood (BLOOD GLUCOSE TEST STRIPS) STRP 1 each by In Vitro route in the morning, at noon, and at bedtime. May substitute to any manufacturer covered by patient's insurance. 100 strip 0   Lancet Device MISC 1 each by Does not apply route in the morning, at noon, and at bedtime. May substitute to any manufacturer covered by patient's insurance. 1 each 0   Lancets Misc. MISC 1 each by Does not apply route in the morning, at noon, and at bedtime. May substitute to any manufacturer covered by patient's insurance. 100 each 0   losartan  (COZAAR ) 50 MG tablet Take 1  tablet (50 mg total) by mouth daily. 90 tablet 3   meloxicam  (MOBIC ) 15 MG tablet Take 1 tablet (15 mg total) by mouth daily. 30 tablet 0   metFORMIN  (GLUCOPHAGE ) 1000 MG tablet Take 1 tablet (1,000 mg total) by mouth 2 (two) times daily with a meal. 180 tablet 3   pravastatin  (PRAVACHOL ) 20 MG tablet Take 1 tablet (20 mg total) by mouth daily. 90 tablet 3   blood glucose meter kit and supplies KIT Dispense based on patient and insurance preference. Use daily as needed to check blood sugar. (Patient not taking: Reported on 03/12/2024) 1 each 0   No current facility-administered medications for this visit.    Allergies-reviewed and updated No Known Allergies  Social History    Socioeconomic History   Marital status: Married    Spouse name: Not on file   Number of children: Not on file   Years of education: Not on file   Highest education level: Not on file  Occupational History   Not on file  Tobacco Use   Smoking status: Former   Smokeless tobacco: Never  Vaping Use   Vaping status: Never Used  Substance and Sexual Activity   Alcohol use: No    Alcohol/week: 0.0 standard drinks of alcohol   Drug use: No   Sexual activity: Yes    Partners: Female  Other Topics Concern   Not on file  Social History Narrative   Not on file   Social Drivers of Health   Financial Resource Strain: Low Risk  (05/09/2022)   Overall Financial Resource Strain (CARDIA)    Difficulty of Paying Living Expenses: Not hard at all  Food Insecurity: No Food Insecurity (05/09/2022)   Hunger Vital Sign    Worried About Running Out of Food in the Last Year: Never true    Ran Out of Food in the Last Year: Never true  Transportation Needs: No Transportation Needs (05/09/2022)   PRAPARE - Administrator, Civil Service (Medical): No    Lack of Transportation (Non-Medical): No  Physical Activity: Inactive (05/09/2022)   Exercise Vital Sign    Days of Exercise per Week: 0 days    Minutes of Exercise per Session: 0 min  Stress: No Stress Concern Present (05/09/2022)   [employer] of Occupational Health - Occupational Stress Questionnaire    Feeling of Stress : Not at all  Social Connections: Moderately Integrated (05/09/2022)   Social Connection and Isolation Panel    Frequency of Communication with Friends and Family: More than three times a week    Frequency of Social Gatherings with Friends and Family: More than three times a week    Attends Religious Services: More than 4 times per year    Active Member of [employer] or Organizations: No    Attends Engineer, structural: Never    Marital Status: Married        Objective:  Physical Exam: BP 118/70  (BP Location: Left Arm, Patient Position: Sitting, Cuff Size: Normal)   Pulse 62   Temp 98.1 F (36.7 C)   Ht 5' 9 (1.753 m)   Wt 161 lb 3.2 oz (73.1 kg)   SpO2 96%   BMI 23.81 kg/m   Body mass index is 23.81 kg/m. Wt Readings from Last 3 Encounters:  03/12/24 161 lb 3.2 oz (73.1 kg)  03/01/24 161 lb (73 kg)  11/20/23 161 lb (73 kg)   Gen: NAD, resting comfortably HEENT: TMs normal bilaterally. OP clear.  No thyromegaly noted.  CV: RRR with no murmurs appreciated Pulm: NWOB, CTAB with no crackles, wheezes, or rhonchi GI: Normal bowel sounds present. Soft, Nontender, Nondistended. MSK: no edema, cyanosis, or clubbing noted Skin: warm, dry Neuro: CN2-12 grossly intact. Strength 5/5 in upper and lower extremities. Reflexes symmetric and intact bilaterally.  Psych: Normal affect and thought content     Ulric Salzman M. Kennyth, MD 03/12/2024 9:19 AM

## 2024-03-12 NOTE — Patient Instructions (Signed)
 It was very nice to see you today!  VISIT SUMMARY: Today, you had your annual physical exam where we discussed your diabetes management, knee pain, and abdominal pain. We also reviewed your history of colon polyps and planned for future screenings.  YOUR PLAN: TYPE 2 DIABETES MELLITUS: You have type 2 diabetes and need to manage it through diet and regular blood sugar monitoring. -Refill prescription for test strips and lancets. -Order A1c test to assess blood sugar control.  KNEE PAIN: You have been experiencing knee pain, which is being managed by sports medicine specialists. You experience swelling after biking. -Continue low-impact exercises like biking as tolerated.  ABDOMINAL PAIN, INTERMITTENT WITH CHOLELITHIASIS: You have a history of gallstones and occasional abdominal pain. -Order blood work to assess pancreas function. -Let us  kno if you change her mind about seeing GI.  HISTORY OF COLON POLYP, REMOVED: You had a colon polyp removed in 2020 and are scheduled for a follow-up colonoscopy in 2027. -Continue with the planned follow-up colonoscopy in 2027.  Return in about 6 months (around 09/12/2024) for Follow Up.   Take care, Dr Kennyth  PLEASE NOTE:  If you had any lab tests, please let us  know if you have not heard back within a few days. You may see your results on mychart before we have a chance to review them but we will give you a call once they are reviewed by us .   If we ordered any referrals today, please let us  know if you have not heard from their office within the next week.   If you had any urgent prescriptions sent in today, please check with the pharmacy within an hour of our visit to make sure the prescription was transmitted appropriately.   Please try these tips to maintain a healthy lifestyle:  Eat at least 3 REAL meals and 1-2 snacks per day.  Aim for no more than 5 hours between eating.  If you eat breakfast, please do so within one hour of getting up.    Each meal should contain half fruits/vegetables, one quarter protein, and one quarter carbs (no bigger than a computer mouse)  Cut down on sweet beverages. This includes juice, soda, and sweet tea.   Drink at least 1 glass of water with each meal and aim for at least 8 glasses per day  Exercise at least 150 minutes every week.     Preventive Care 61 Years and Older, Male Preventive care refers to lifestyle choices and visits with your health care provider that can promote health and wellness. Preventive care visits are also called wellness exams. What can I expect for my preventive care visit? Counseling During your preventive care visit, your health care provider may ask about your: Medical history, including: Past medical problems. Family medical history. History of falls. Current health, including: Emotional well-being. Home life and relationship well-being. Sexual activity. Memory and ability to understand (cognition). Lifestyle, including: Alcohol, nicotine or tobacco, and drug use. Access to firearms. Diet, exercise, and sleep habits. Work and work Astronomer. Sunscreen use. Safety issues such as seatbelt and bike helmet use. Physical exam Your health care provider will check your: Height and weight. These may be used to calculate your BMI (body mass index). BMI is a measurement that tells if you are at a healthy weight. Waist circumference. This measures the distance around your waistline. This measurement also tells if you are at a healthy weight and may help predict your risk of certain diseases, such as type  2 diabetes and high blood pressure. Heart rate and blood pressure. Body temperature. Skin for abnormal spots. What immunizations do I need?  Vaccines are usually given at various ages, according to a schedule. Your health care provider will recommend vaccines for you based on your age, medical history, and lifestyle or other factors, such as travel or where  you work. What tests do I need? Screening Your health care provider may recommend screening tests for certain conditions. This may include: Lipid and cholesterol levels. Diabetes screening. This is done by checking your blood sugar (glucose) after you have not eaten for a while (fasting). Hepatitis C test. Hepatitis B test. HIV (human immunodeficiency virus) test. STI (sexually transmitted infection) testing, if you are at risk. Lung cancer screening. Colorectal cancer screening. Prostate cancer screening. Abdominal aortic aneurysm (AAA) screening. You may need this if you are a current or former smoker. Talk with your health care provider about your test results, treatment options, and if necessary, the need for more tests. Follow these instructions at home: Eating and drinking  Eat a diet that includes fresh fruits and vegetables, whole grains, lean protein, and low-fat dairy products. Limit your intake of foods with high amounts of sugar, saturated fats, and salt. Take vitamin and mineral supplements as recommended by your health care provider. Do not drink alcohol if your health care provider tells you not to drink. If you drink alcohol: Limit how much you have to 0-2 drinks a day. Know how much alcohol is in your drink. In the [place] one drink equals one 12 oz bottle of beer (355 mL), one 5 oz glass of wine (148 mL), or one 1 oz glass of hard liquor (44 mL). Lifestyle Brush your teeth every morning and night with fluoride  toothpaste. Floss one time each day. Exercise for at least 30 minutes 5 or more days each week. Do not use any products that contain nicotine or tobacco. These products include cigarettes, chewing tobacco, and vaping devices, such as e-cigarettes. If you need help quitting, ask your health care provider. Do not use drugs. If you are sexually active, practice safe sex. Use a condom or other form of protection to prevent STIs. Take aspirin only as told by your  health care provider. Make sure that you understand how much to take and what form to take. Work with your health care provider to find out whether it is safe and beneficial for you to take aspirin daily. Ask your health care provider if you need to take a cholesterol-lowering medicine (statin). Find healthy ways to manage stress, such as: Meditation, yoga, or listening to music. Journaling. Talking to a trusted person. Spending time with friends and family. Safety Always wear your seat belt while driving or riding in a vehicle. Do not drive: If you have been drinking alcohol. Do not ride with someone who has been drinking. When you are tired or distracted. While texting. If you have been using any mind-altering substances or drugs. Wear a helmet and other protective equipment during sports activities. If you have firearms in your house, make sure you follow all gun safety procedures. Minimize exposure to UV radiation to reduce your risk of skin cancer. What's next? Visit your health care provider once a year for an annual wellness visit. Ask your health care provider how often you should have your eyes and teeth checked. Stay up to date on all vaccines. This information is not intended to replace advice given to you by your health care  provider. Make sure you discuss any questions you have with your health care provider. Document Revised: 12/30/2020 Document Reviewed: 12/30/2020 Elsevier Patient Education  2024 [employer].

## 2024-03-12 NOTE — Assessment & Plan Note (Signed)
At goal today on losartan 50 mg daily. 

## 2024-03-12 NOTE — Assessment & Plan Note (Signed)
 Check A1c.  On metformin  1000 mg twice daily.  Discussed lifestyle modifications.

## 2024-03-12 NOTE — Assessment & Plan Note (Signed)
 On pravastatin  20 mg daily.  Check labs.

## 2024-03-12 NOTE — Assessment & Plan Note (Signed)
 Check TSH

## 2024-03-14 ENCOUNTER — Ambulatory Visit: Payer: Self-pay | Admitting: Family Medicine

## 2024-03-14 NOTE — Progress Notes (Signed)
 His A1c is elevated to 8.0 I would like to see him back in 3 months to recheck.  He can continue the metformin  1000 mg twice daily though he should continue to work on cutting down on sugar and carbs.  His thyroid  level is stable.  Will continue to monitor this in 6 months.  His cholesterol levels are at goal.  He is urine test is stable compared to previous.  His lipase which is a pancreas enzyme is normal.  PSA is normal.  All of his other labs are at goal.  He should let us  know if he changes his mind about going back to GI for his abdominal pain or if he has developed any new symptoms since our last visit.

## 2024-04-08 NOTE — Progress Notes (Unsigned)
 [name] [address] [place] [address] Phone: [phone]   Assessment and Plan:     1. Primary osteoarthritis of right knee (Primary) 2. Chronic pain of both knees -Chronic with exacerbation, subsequent visit - Continued pain in bilateral knees, worse in right.  Right knee pain is most consistent with moderate osteoarthritis and medial meniscal tear as seen on prior MRI.  Will further evaluate left knee pain with x-ray at today's visit - Use meloxicam  15 mg daily as needed for pain.  Recommend limiting chronic NSAIDs to 1-2 doses per week to prevent long-term side effects. - Use Tylenol 500 to 1000 mg tablets 2-3 times a day for day-to-day pain relief - Patient had mild and temporary relief after intra-articular CSI on 11/20/2023.  Patient is agreeable for Zilretta  injection which should provide more complete and longer lasting relief.  Will order prior authorization today and patient to follow-up in clinic once approved for right knee Zilretta  injection, and left knee CSI -Continue HEP and physical therapy  3. Chronic bilateral low back pain without sciatica -Chronic with exacerbation, initial visit - Patient planing of nonradiating low back pain.  Likely flared with chronic knee pain - Use meloxicam  15 mg daily as needed for pain.  Recommend limiting chronic NSAIDs to 1-2 doses per week to prevent long-term side effects. - Use Tylenol 500 to 1000 mg tablets 2-3 times a day for day-to-day pain relief -Start Flexeril  5 mg nightly as needed for muscle spasms  Patient accompanied by his wife.  Interpreter used throughout entirety of office visit.  Time of visit 43 minutes, which included chart review, physical exam, treatment plan being performed, interpreted, and discussed with patient at today's visit.   Pertinent previous records reviewed include none   Follow Up: Will contact patient once Zilretta  injection is approved  and patient can follow-up for right knee Zilretta  injection and left knee CSI   Subjective:   I, Larayah Clute, am serving as a Neurosurgeon for Doctor Morene Mace   Chief Complaint: right knee pain    HPI:  11/20/2023 Rockne Dearinger is a 73 y.o. male who presents to [facility] at [facility] today for R knee pain. Pt was last seen by Dr. Joane on 06/27/23 and was given a repeat R knee steroid injection and advised to cont quad strengthening. Avoid high-dose oral NSAIDs.    Today, pt reports exacerbation of right knee pain over the past month. Denies swelling. Denies mechanical sx. Locates pain distal to patella. Denies new injury. Has not been working on quad strengthening regularly d/t knee pain. Pain worse with knee flexion, squatting. Has not taken any meds for this exacerbation.    Dx imaging: 03/17/22 R knee XR 01/20/21 R knee XR    Pertinent review of systems: No fevers or chills   Relevant historical information: Hypertension and diabetes     Exam:  BP 130/82   Pulse 77   Ht 5' 9 (1.753 m)   Wt 161 lb (73 kg)   SpO2 96%   BMI 23.78 kg/m  General: Well Developed, well nourished, and in no acute distress.    MSK: Right knee decreased quadricep bulk.  Normal knee motion.   03/01/2024 Patient states bilateral knee pain   04/09/2024 Patient states while he was on meds he was okay. Pain has come back since stopping the meds   Relevant Historical Information: HTN, DM II  Additional pertinent review of systems  negative.   Current Outpatient Medications:    cyclobenzaprine  (FLEXERIL ) 5 MG tablet, Take 1 tablet (5 mg total) by mouth at bedtime as needed for muscle spasms., Disp: 15 tablet, Rfl: 0   aspirin EC 81 MG tablet, Take 81 mg by mouth daily., Disp: , Rfl:    blood glucose meter kit and supplies KIT, Dispense based on patient and insurance preference. Use daily as needed to check blood sugar. (Patient not taking: Reported on 03/12/2024), Disp: 1 each, Rfl: 0    Blood Glucose Monitoring Suppl DEVI, 1 each by Does not apply route in the morning, at noon, and at bedtime. May substitute to any manufacturer covered by patient's insurance., Disp: 1 each, Rfl: 0   Glucose Blood (BLOOD GLUCOSE TEST STRIPS) STRP, 1 each by In Vitro route in the morning, at noon, and at bedtime. May substitute to any manufacturer covered by patient's insurance., Disp: 100 strip, Rfl: 0   Lancet Device MISC, 1 each by Does not apply route in the morning, at noon, and at bedtime. May substitute to any manufacturer covered by patient's insurance., Disp: 1 each, Rfl: 0   Lancets Misc. MISC, 1 each by Does not apply route in the morning, at noon, and at bedtime. May substitute to any manufacturer covered by patient's insurance., Disp: 100 each, Rfl: 0   losartan  (COZAAR ) 50 MG tablet, Take 1 tablet (50 mg total) by mouth daily., Disp: 90 tablet, Rfl: 3   meloxicam  (MOBIC ) 15 MG tablet, Take 1 tablet (15 mg total) by mouth daily as needed for pain., Disp: 30 tablet, Rfl: 0   metFORMIN  (GLUCOPHAGE ) 1000 MG tablet, Take 1 tablet (1,000 mg total) by mouth 2 (two) times daily with a meal., Disp: 180 tablet, Rfl: 3   pravastatin  (PRAVACHOL ) 20 MG tablet, Take 1 tablet (20 mg total) by mouth daily., Disp: 90 tablet, Rfl: 3   Objective:     Vitals:   04/09/24 0941  Pulse: 93  SpO2: 98%  Weight: 161 lb (73 kg)  Height: 5' 9 (1.753 m)      Body mass index is 23.78 kg/m.    Physical Exam:    General:  awake, alert oriented, no acute distress nontoxic Skin: no suspicious lesions or rashes Neuro:sensation intact and strength 5/5 with no deficits, no atrophy, normal muscle tone Psych: No signs of anxiety, depression or other mood disorder   bilateral Knee: No swelling No deformity Neg fluid wave, joint milking ROM Flex 110, Ext 0 TTP medial and lateral joint line NTTP over the quad tendon,  patella, plica, patella tendon, tibial tuberostiy, fibular head, posterior fossa, pes  anserine bursa, gerdy's tubercle,     Gait normal   Electronically signed by:  Odis Mace D.[facility] 10:11 AM 04/09/24

## 2024-04-09 ENCOUNTER — Telehealth: Payer: Self-pay

## 2024-04-09 ENCOUNTER — Ambulatory Visit

## 2024-04-09 ENCOUNTER — Ambulatory Visit: Admitting: Sports Medicine

## 2024-04-09 VITALS — HR 93 | Ht 69.0 in | Wt 161.0 lb

## 2024-04-09 DIAGNOSIS — M1711 Unilateral primary osteoarthritis, right knee: Secondary | ICD-10-CM

## 2024-04-09 DIAGNOSIS — M25562 Pain in left knee: Secondary | ICD-10-CM | POA: Diagnosis not present

## 2024-04-09 DIAGNOSIS — M545 Low back pain, unspecified: Secondary | ICD-10-CM | POA: Diagnosis not present

## 2024-04-09 DIAGNOSIS — M25561 Pain in right knee: Secondary | ICD-10-CM | POA: Diagnosis not present

## 2024-04-09 DIAGNOSIS — G8929 Other chronic pain: Secondary | ICD-10-CM

## 2024-04-09 DIAGNOSIS — M25469 Effusion, unspecified knee: Secondary | ICD-10-CM | POA: Diagnosis not present

## 2024-04-09 MED ORDER — MELOXICAM 15 MG PO TABS: 15.0000 mg | ORAL_TABLET | Freq: Every day | ORAL | 0 refills | Status: AC | PRN

## 2024-04-09 MED ORDER — CYCLOBENZAPRINE HCL 5 MG PO TABS: 5.0000 mg | ORAL_TABLET | Freq: Every evening | ORAL | 0 refills | Status: AC | PRN

## 2024-04-09 NOTE — Telephone Encounter (Signed)
-----   Message from [facility] sent at 04/09/2024 10:09 AM EDT ----- Right knee zilretta

## 2024-04-09 NOTE — Telephone Encounter (Signed)
 Patient ran for Zilretta  for right knee. Case ID: [id number]. Pending approval.

## 2024-04-09 NOTE — Patient Instructions (Addendum)
 Tylenol [phone] mg 2-3 times a day for pain relief   - Use meloxicam  15 mg daily as needed for pain.  Recommend limiting chronic NSAIDs to 1-2 doses per week to prevent long-term side effects.  Refill meloxicam    PT referral   R knee zilretta   Xrays on the way out   Flexeril  5 mg nightly as needed   We will contact you once zilretta  is approved

## 2024-04-10 NOTE — Telephone Encounter (Signed)
 Requires PA. Faxed PA with notes 04/10/24. Pending approval.

## 2024-04-15 ENCOUNTER — Ambulatory Visit: Payer: Self-pay | Admitting: Sports Medicine

## 2024-04-18 ENCOUNTER — Telehealth: Payer: Self-pay | Admitting: Sports Medicine

## 2024-04-18 ENCOUNTER — Telehealth: Payer: Self-pay | Admitting: *Deleted

## 2024-04-18 DIAGNOSIS — H04123 Dry eye syndrome of bilateral lacrimal glands: Secondary | ICD-10-CM | POA: Diagnosis not present

## 2024-04-18 DIAGNOSIS — H40003 Preglaucoma, unspecified, bilateral: Secondary | ICD-10-CM | POA: Diagnosis not present

## 2024-04-18 DIAGNOSIS — H01009 Unspecified blepharitis unspecified eye, unspecified eyelid: Secondary | ICD-10-CM | POA: Diagnosis not present

## 2024-04-18 DIAGNOSIS — E113293 Type 2 diabetes mellitus with mild nonproliferative diabetic retinopathy without macular edema, bilateral: Secondary | ICD-10-CM | POA: Diagnosis not present

## 2024-04-18 NOTE — Telephone Encounter (Signed)
 Called back to speak with patients daughter no answer

## 2024-04-18 NOTE — Telephone Encounter (Signed)
 Patients daughter called and said Dr. Leonce mentioned getting approval from insurance to get approval for a new shot. Daughter would like someone to give her a call back. She also got a call from [facility] and her dad said he did do PT for a while and it was not getting better. Patient states her dad did not know tere was a referral for PT and they would like to know if he feels strongly that it will help her dad or if they should wait until he gets his injection. They need to know if they should schedule with therapist or not. Please advise.

## 2024-04-18 NOTE — Telephone Encounter (Signed)
 Copied from CRM [id number]. Topic: Referral - Question >> Apr 16, 2024 10:09 AM Aleatha BROCKS wrote: Reason for CRM: Patient Daughter would like a call about the [facility] Referral Richard Reeves (daughter) [phone]  Spoke with Richard Patient's daughter  Advise to call sport medicine for information of steroid shot   Phone number provider to her for sport medicine   Odis Mace D.[facility] [address] [place] [address] Phone: [phone]  Richard Reeves

## 2024-04-18 NOTE — Telephone Encounter (Signed)
 Can you schedule patient when someone calls back please   Zilretta  authorized right knee Coinsurance 20% Deductible does not apply to these services OOP Max $4150.00 has met $191.21 Copay $30.00 Once OOP has been met coverage goes to 100% and copay will no longer apply  PRE CERT APPROVED [facility] 04/10/24-07/10/24

## 2024-04-18 NOTE — Telephone Encounter (Signed)
 Still waiting for the insurance to fax back the prior authorization approval for Zilretta .

## 2024-04-18 NOTE — Telephone Encounter (Signed)
 Holding until medication has arrived.

## 2024-04-22 NOTE — Telephone Encounter (Signed)
Scheduled 10/9

## 2024-04-24 NOTE — Progress Notes (Unsigned)
 Richard Reeves D.[facility] [address] [place] [address] Phone: [phone]   Assessment and Plan:     1. Primary osteoarthritis of right knee (Primary) 2. Chronic pain of both knees -Chronic with exacerbation, subsequent visit - Continued pain in bilateral knees, worse on right.  Consistent with flare of osteoarthritis with moderate osteoarthritis and medial meniscal tear seen on right knee on MRI - Patient only had temporary relief after intra-articular CSI to right knee on 11/20/2023, so elected for intra-articular Zilretta  injection at today's visit.  Tolerated well per note below - Patient elected for intra-articular CSI to left knee at today's visit.  Tolerated well per note below. - Use meloxicam  15 mg daily as needed for breakthrough pain.  Recommend limiting chronic NSAIDs to 1-2 doses per week to prevent long-term side effects. Use Tylenol 500 to 1000 mg tablets 2-3 times a day as needed for day-to-day pain relief.    - Continue HEP.  Offered physical therapy, however patient is leaving the state for the next 1 to 2 months and will not be able to complete physical therapy  Procedure: Knee Joint Injection Side: Bilateral Indication: Flare of osteoarthritis  Risks explained and consent was given verbally. The left knee site was cleaned with alcohol prep. A needle was introduced with an anterio-lateral approach. Injection given using 2mL of 1% lidocaine without epinephrine and 1mL of kenalog 40mg /ml. This was well tolerated.  Needle was removed, hemostasis achieved, and post injection instructions were explained.  Procedure was repeated on contralateral side, but injectate was changed to Zilretta  32 mg.  Pt was advised to call or return to clinic if these symptoms worsen or fail to improve as anticipated.     Pertinent previous records reviewed include none   Follow Up: 2 months for reevaluation.  Could consider additional CSI versus  physical therapy   Subjective:   I, Richard Reeves, am serving as a Neurosurgeon for Doctor Morene Mace   Chief Complaint: right knee pain    HPI:  11/20/2023 Richard Reeves is a 73 y.o. male who presents to [facility] at [facility] today for R knee pain. Pt was last seen by Dr. Joane on 06/27/23 and was given a repeat R knee steroid injection and advised to cont quad strengthening. Avoid high-dose oral NSAIDs.    Today, pt reports exacerbation of right knee pain over the past month. Denies swelling. Denies mechanical sx. Locates pain distal to patella. Denies new injury. Has not been working on quad strengthening regularly d/t knee pain. Pain worse with knee flexion, squatting. Has not taken any meds for this exacerbation.    Dx imaging: 03/17/22 R knee XR 01/20/21 R knee XR    Pertinent review of systems: No fevers or chills   Relevant historical information: Hypertension and diabetes     Exam:  BP 130/82   Pulse 77   Ht 5' 9 (1.753 m)   Wt 161 lb (73 kg)   SpO2 96%   BMI 23.78 kg/m  General: Well Developed, well nourished, and in no acute distress.    MSK: Right knee decreased quadricep bulk.  Normal knee motion.   03/01/2024 Patient states bilateral knee pain    04/09/2024 Patient states while he was on meds he was okay. Pain has come back since stopping the meds  04/25/2024 Patient states he is the same. Ready for injections   Relevant Historical Information: HTN, DM II  Additional pertinent review of  systems negative.   Current Outpatient Medications:    aspirin EC 81 MG tablet, Take 81 mg by mouth daily., Disp: , Rfl:    blood glucose meter kit and supplies KIT, Dispense based on patient and insurance preference. Use daily as needed to check blood sugar. (Patient not taking: Reported on 03/12/2024), Disp: 1 each, Rfl: 0   Blood Glucose Monitoring Suppl DEVI, 1 each by Does not apply route in the morning, at noon, and at bedtime. May substitute to any  manufacturer covered by patient's insurance., Disp: 1 each, Rfl: 0   cyclobenzaprine  (FLEXERIL ) 5 MG tablet, Take 1 tablet (5 mg total) by mouth at bedtime as needed for muscle spasms., Disp: 15 tablet, Rfl: 0   losartan  (COZAAR ) 50 MG tablet, Take 1 tablet (50 mg total) by mouth daily., Disp: 90 tablet, Rfl: 3   meloxicam  (MOBIC ) 15 MG tablet, Take 1 tablet (15 mg total) by mouth daily as needed for pain., Disp: 30 tablet, Rfl: 0   metFORMIN  (GLUCOPHAGE ) 1000 MG tablet, Take 1 tablet (1,000 mg total) by mouth 2 (two) times daily with a meal., Disp: 180 tablet, Rfl: 3   pravastatin  (PRAVACHOL ) 20 MG tablet, Take 1 tablet (20 mg total) by mouth daily., Disp: 90 tablet, Rfl: 3   Objective:     Vitals:   04/25/24 0952  BP: 120/68  Pulse: (!) 102  SpO2: 96%  Weight: 159 lb (72.1 kg)  Height: 5' 9 (1.753 m)      Body mass index is 23.48 kg/m.    Physical Exam:    General:  awake, alert oriented, no acute distress nontoxic Skin: no suspicious lesions or rashes Neuro:sensation intact and strength 5/5 with no deficits, no atrophy, normal muscle tone Psych: No signs of anxiety, depression or other mood disorder   bilateral Knee: No swelling No deformity Neg fluid wave, joint milking ROM Flex 110, Ext 0 TTP medial and lateral joint line NTTP over the quad tendon,  patella, plica, patella tendon, tibial tuberostiy, fibular head, posterior fossa, pes anserine bursa, gerdy's tubercle,     Gait normal    Electronically signed by:  Odis Mace D.[facility] 10:07 AM 04/25/24

## 2024-04-25 ENCOUNTER — Ambulatory Visit: Admitting: Sports Medicine

## 2024-04-25 VITALS — BP 120/68 | HR 102 | Ht 69.0 in | Wt 159.0 lb

## 2024-04-25 DIAGNOSIS — G8929 Other chronic pain: Secondary | ICD-10-CM

## 2024-04-25 DIAGNOSIS — M1711 Unilateral primary osteoarthritis, right knee: Secondary | ICD-10-CM | POA: Diagnosis not present

## 2024-04-25 DIAGNOSIS — M25562 Pain in left knee: Secondary | ICD-10-CM | POA: Diagnosis not present

## 2024-04-25 DIAGNOSIS — M25561 Pain in right knee: Secondary | ICD-10-CM | POA: Diagnosis not present

## 2024-04-25 MED ORDER — TRIAMCINOLONE ACETONIDE 32 MG IX SRER
32.0000 mg | Freq: Once | INTRA_ARTICULAR | Status: AC
  Administered 2024-04-25: 32 mg via INTRA_ARTICULAR

## 2024-06-12 ENCOUNTER — Telehealth: Payer: Self-pay

## 2024-06-12 NOTE — Telephone Encounter (Signed)
 Received a fax from [facility] to notify provider that starting January 1st, 2026 they will no longer cover one touch verio test strips and one touch delica plus lancing device. However they will cover accu check test strips and accu check lancing device. Please advise on switching at the first of the year.

## 2024-06-24 NOTE — Progress Notes (Unsigned)
 Richard Reeves D.[facility] [address] [place] [address] Phone: [phone]   Assessment and Plan:     1. Primary osteoarthritis of right knee (Primary) 2. Chronic pain of right knee 3. Chronic pain of left knee -Chronic with exacerbation, subsequent visit - Overall significant improvement in bilateral knee pain with right knee Zilretta  injection and left knee CSI (Kenalog ) performed on 04/25/2024.  Consistent with resolving flare of osteoarthritis - Use meloxicam  15 mg daily as needed for breakthrough pain.  Recommend limiting chronic NSAIDs to 1-2 doses per week to prevent long-term side effects. Use Tylenol 500 to 1000 mg tablets 2-3 times a day as needed for day-to-day pain relief.    - Continue HEP - Could consider repeat CSI versus Zilretta  injections in the future, though I believe Zilretta  would provide patient with longer lasting relief.  4. Trigger middle finger of right hand -Acute, initial visit - Early stages of trigger finger of right third digit without active triggering - Advised to avoid trauma to base of third digit - Use Voltaren  gel topically over areas of pain    Pertinent previous records reviewed include none  Interpreter present and used throughout entirety of office visit.  Follow Up: As needed.  Could consider trigger finger CSI.  Could reauthorize Zilretta  injections for bilateral knees in 2026 for repeat injections when necessary   Subjective:   I, Richard Reeves am a scribe for Dr. Leonce.     Chief Complaint: right knee pain    HPI:  11/20/2023 Richard Reeves is a 73 y.o. male who presents to [facility] at [facility] today for R knee pain. Pt was last seen by Dr. Joane on 06/27/23 and was given a repeat R knee steroid injection and advised to cont quad strengthening. Avoid high-dose oral NSAIDs.    Today, pt reports exacerbation of right knee pain over the past month. Denies swelling. Denies  mechanical sx. Locates pain distal to patella. Denies new injury. Has not been working on quad strengthening regularly d/t knee pain. Pain worse with knee flexion, squatting. Has not taken any meds for this exacerbation.    Dx imaging: 03/17/22 R knee XR 01/20/21 R knee XR    Pertinent review of systems: No fevers or chills   Relevant historical information: Hypertension and diabetes     Exam:  BP 130/82   Pulse 77   Ht 5' 9 (1.753 m)   Wt 161 lb (73 kg)   SpO2 96%   BMI 23.78 kg/m  General: Well Developed, well nourished, and in no acute distress.    MSK: Right knee decreased quadricep bulk.  Normal knee motion.   03/01/2024 Patient states bilateral knee pain    04/09/2024 Patient states while he was on meds he was okay. Pain has come back since stopping the meds   04/25/2024 Patient states he is the same. Ready for injections  06/25/2024 Patient states doing ok. Improvement from last visit. The injections helped.    Relevant Historical Information: HTN, DM II  Additional pertinent review of systems negative.   Current Outpatient Medications:    aspirin EC 81 MG tablet, Take 81 mg by mouth daily., Disp: , Rfl:    blood glucose meter kit and supplies KIT, Dispense based on patient and insurance preference. Use daily as needed to check blood sugar., Disp: 1 each, Rfl: 0   Blood Glucose Monitoring Suppl DEVI, 1 each by Does not apply route in the  morning, at noon, and at bedtime. May substitute to any manufacturer covered by patient's insurance., Disp: 1 each, Rfl: 0   cyclobenzaprine  (FLEXERIL ) 5 MG tablet, Take 1 tablet (5 mg total) by mouth at bedtime as needed for muscle spasms., Disp: 15 tablet, Rfl: 0   losartan  (COZAAR ) 50 MG tablet, Take 1 tablet (50 mg total) by mouth daily., Disp: 90 tablet, Rfl: 3   meloxicam  (MOBIC ) 15 MG tablet, Take 1 tablet (15 mg total) by mouth daily as needed for pain., Disp: 30 tablet, Rfl: 0   metFORMIN  (GLUCOPHAGE ) 1000 MG tablet, Take 1  tablet (1,000 mg total) by mouth 2 (two) times daily with a meal., Disp: 180 tablet, Rfl: 3   pravastatin  (PRAVACHOL ) 20 MG tablet, Take 1 tablet (20 mg total) by mouth daily., Disp: 90 tablet, Rfl: 3   Objective:     Vitals:   06/25/24 1016  BP: 128/60  Pulse: 78  SpO2: 99%  Weight: 158 lb (71.7 kg)  Height: 5' 9 (1.753 m)      Body mass index is 23.33 kg/m.    Physical Exam:    General:  awake, alert oriented, no acute distress nontoxic Skin: no suspicious lesions or rashes Neuro:sensation intact and strength 5/5 with no deficits, no atrophy, normal muscle tone Psych: No signs of anxiety, depression or other mood disorder  Bilateral knee: No swelling No deformity Neg fluid wave, joint milking ROM Flex 110, Ext 0 NTTP over the quad tendon, medial fem condyle, lat fem condyle, patella, patella tendon, tibial tuberostiy, fibular head, posterior fossa, pes anserine bursa, gerdy's tubercle, medial jt line, lateral jt line  Gait normal   Mild TTP palmar surface of third MCP with very small palpable nodule.  No active triggering.  Electronically signed by:  Richard Reeves D.[facility] 10:46 AM 06/25/24

## 2024-06-25 ENCOUNTER — Ambulatory Visit: Admitting: Sports Medicine

## 2024-06-25 VITALS — BP 128/60 | HR 78 | Ht 69.0 in | Wt 158.0 lb

## 2024-06-25 DIAGNOSIS — M25562 Pain in left knee: Secondary | ICD-10-CM | POA: Diagnosis not present

## 2024-06-25 DIAGNOSIS — M1711 Unilateral primary osteoarthritis, right knee: Secondary | ICD-10-CM | POA: Diagnosis not present

## 2024-06-25 DIAGNOSIS — M65331 Trigger finger, right middle finger: Secondary | ICD-10-CM

## 2024-06-25 DIAGNOSIS — G8929 Other chronic pain: Secondary | ICD-10-CM

## 2024-06-25 DIAGNOSIS — M25561 Pain in right knee: Secondary | ICD-10-CM | POA: Diagnosis not present

## 2024-06-25 NOTE — Patient Instructions (Signed)
 Thank you for coming in today  Please contact our clinic once you get your new insurance information in 2026 and give us  the updated information.  Once your insurance information has been updated, we can order new Zilretta  injections for both of your knees and you can follow-up as needed for repeat injections.  Use Voltaren  gel topically over the tender area of your finger to prevent trigger finger.  If rest and Voltaren  gel are not helpful, or if finger begins to trigger/get stuck, recommend following up in clinic for trigger finger steroid injection

## 2024-07-15 ENCOUNTER — Ambulatory Visit: Admitting: Family Medicine

## 2024-07-15 ENCOUNTER — Encounter: Payer: Self-pay | Admitting: Pharmacist

## 2024-07-15 NOTE — Progress Notes (Signed)
 Pharmacy Quality Measure Review  This patient is appearing on a report for being at risk of failing the adherence measure for diabetes medications this calendar year.   Medication: metformin  Last fill date: 04/24/2024 for 90 day supply per adherence report.   Reviewed refill records. Patient filled metformin , pravastatin  and losartan  for 90 day supply on 07/09/2024.   Insurance report was not up to date. No action needed at this time.   Madelin Ray, PharmD Clinical Pharmacist [facility]  Population Health [phone]

## 2024-07-16 ENCOUNTER — Encounter: Payer: Self-pay | Admitting: Family Medicine

## 2024-07-19 ENCOUNTER — Telehealth: Payer: Self-pay

## 2024-07-19 NOTE — Telephone Encounter (Signed)
 Zilretta  benefits ran for bilateral knee case ID [id number]

## 2024-07-22 ENCOUNTER — Ambulatory Visit (INDEPENDENT_AMBULATORY_CARE_PROVIDER_SITE_OTHER): Admitting: Family Medicine

## 2024-07-22 VITALS — BP 130/78 | HR 86 | Temp 97.7°F | Ht 69.0 in | Wt 158.6 lb

## 2024-07-22 DIAGNOSIS — E785 Hyperlipidemia, unspecified: Secondary | ICD-10-CM | POA: Diagnosis not present

## 2024-07-22 DIAGNOSIS — E1169 Type 2 diabetes mellitus with other specified complication: Secondary | ICD-10-CM | POA: Diagnosis not present

## 2024-07-22 DIAGNOSIS — K219 Gastro-esophageal reflux disease without esophagitis: Secondary | ICD-10-CM | POA: Diagnosis not present

## 2024-07-22 DIAGNOSIS — R519 Headache, unspecified: Secondary | ICD-10-CM | POA: Diagnosis not present

## 2024-07-22 DIAGNOSIS — E1159 Type 2 diabetes mellitus with other circulatory complications: Secondary | ICD-10-CM

## 2024-07-22 DIAGNOSIS — I152 Hypertension secondary to endocrine disorders: Secondary | ICD-10-CM

## 2024-07-22 DIAGNOSIS — Z7984 Long term (current) use of oral hypoglycemic drugs: Secondary | ICD-10-CM | POA: Diagnosis not present

## 2024-07-22 DIAGNOSIS — I259 Chronic ischemic heart disease, unspecified: Secondary | ICD-10-CM

## 2024-07-22 DIAGNOSIS — E119 Type 2 diabetes mellitus without complications: Secondary | ICD-10-CM

## 2024-07-22 MED ORDER — PANTOPRAZOLE SODIUM 40 MG PO TBEC: 40.0000 mg | DELAYED_RELEASE_TABLET | Freq: Every day | ORAL | 3 refills | Status: AC

## 2024-07-22 NOTE — Assessment & Plan Note (Signed)
 Blood pressure at goal today on losartan  50 mg daily.  He will continue monitoring at home.  Would consider stopping or decreasing dose if he is having symptomatic lows at home.

## 2024-07-22 NOTE — Telephone Encounter (Signed)
 Re-ran Zilretta  benefits for bilateral knee with correct insurance [employer] case ID [id number]

## 2024-07-22 NOTE — Assessment & Plan Note (Signed)
On pravastatin 20 mg daily

## 2024-07-22 NOTE — Progress Notes (Signed)
 "  Richard Reeves is a 74 y.o. male who presents today for an office visit.  History provided by patient via Korean interpreter.  Assessment/Plan:  New/Acute Problems: Chest Pain Based on history it sounds like it is likely GI related however given his age and comorbidities including dyslipidemia, diabetes, and hypertension would be reasonable for us  to pursue cardiac workup at this point.  Will place referral to cardiology.  We are starting Protonix  40 mg daily as well for his GERD and history of gastritis.  He will follow-up with us  in a week or so to let us  know how he is doing.  We discussed reasons to return to care sooner.   Headache No red flags.  Overall reassuring exam today.  Based on history it sounds like this is potentially related to postprandial hypotension.  Given his age and new onset headaches however would be reasonable for us  to pursue imaging at this point.  Will place order for brain MRI to further evaluate.  Advised him to monitor blood pressure frequently.  We discussed reasons to return to care.   Chronic Problems Addressed Today: Gastroesophageal reflux disease Patient with worsening intermittent epigastric pain over the last several months.  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  This is likely contributing to his above chest discomfort.  Will restart Protonix .  He will let us  know if not improving in the next few weeks and he will need to follow back up with GI soon.  Type 2 diabetes mellitus without complication, without long-term current use of insulin (HCC) On metformin  1000 mg twice daily.  Last A1c was not controlled at 8.0.  He will come back for A1c check soon though we are pursuing cardiac workup as above.  Hyperlipidemia associated with type 2 diabetes mellitus (HCC) On pravastatin  20 mg daily.  Hypertension associated with diabetes (HCC) Blood pressure at goal today on losartan   50 mg daily.  He will continue monitoring at home.  Would consider stopping or decreasing dose if he is having symptomatic lows at home.     Subjective:  HPI:  See assessment / plan for status of chronic conditions.   Discussed the use of AI scribe software for clinical note transcription with the patient, who gave verbal consent to proceed.  History of Present Illness Richard Reeves is a 74 year old male with diabetes, hypertension, and hyperlipidemia who presents with chest pain and headaches.  He has experienced chest pain for years, initially mild and intermittent, but recently more frequent and persistent, lasting up to a week. There is no association with food intake, heartburn, or reflux, although he mentions some difficulty with digestion. No specific dietary triggers have been identified.  He experiences intermittent headaches, sometimes triggered by heavy meals, with a sensation of heaviness in his head. The headaches can be severe but tend to resolve over time. No other specific triggers have been identified.  He monitors his blood pressure at home, with a recent reading of 130/78 mmHg despite not taking his medication that morning.         Objective:  Physical Exam: BP 130/78   Pulse 86   Temp 97.7 F (36.5 C) (Temporal)   Ht 5' 9 (1.753 m)   Wt 158 lb 9.6 oz (71.9 kg)   SpO2 97%   BMI 23.42 kg/m   Gen: No acute distress, resting comfortably CV: Regular rate and rhythm  with no murmurs appreciated Pulm: Normal work of breathing, clear to auscultation bilaterally with no crackles, wheezes, or rhonchi Neuro: Grossly normal, moves all extremities Psych: Normal affect and thought content  EKG: NSR. No acute ischemic changes.       Worth HERO. Kennyth, MD 07/22/2024 12:56 PM  "

## 2024-07-22 NOTE — Assessment & Plan Note (Signed)
 On metformin  1000 mg twice daily.  Last A1c was not controlled at 8.0.  He will come back for A1c check soon though we are pursuing cardiac workup as above.

## 2024-07-22 NOTE — Patient Instructions (Signed)
 It was very nice to see you today!  VISIT SUMMARY: You visited us  today due to chest pain and headaches. We discussed your ongoing health issues, including diabetes, hypertension, and hyperlipidemia, and made plans for further evaluation and management.  YOUR PLAN: Chest pain: You have been experiencing intermittent chest pain. Although your EKG is normal, further evaluation is needed due to your age and other health conditions. -You are referred to a cardiologist for a detailed heart evaluation. -You are prescribed Protonix  to address any potential stomach-related causes of your chest pain.  TYPE 2 DIABETES MELLITUS WITH OTHER CIRCULATORY COMPLICATIONS: Your blood pressure reading was 130/78 mmHg even without taking your medication, which suggests that you might not need the antihypertensive medication for now. -Monitor your blood pressure at home regularly. -Consider stopping your antihypertensive medication for a few weeks while you monitor your blood pressure.  HEADACHE: You have intermittent headaches that might be related to changes in your blood pressure after meals. Your neurological exam is normal. -A brain MRI is ordered to rule out other causes of your headaches. -Monitor your blood pressure during headache episodes. -Consider stopping your antihypertensive medication temporarily and monitor your blood pressure.  Return if symptoms worsen or fail to improve.   Take care, Dr Kennyth  PLEASE NOTE:  If you had any lab tests, please let us  know if you have not heard back within a few days. You may see your results on mychart before we have a chance to review them but we will give you a call once they are reviewed by us .   If we ordered any referrals today, please let us  know if you have not heard from their office within the next week.   If you had any urgent prescriptions sent in today, please check with the pharmacy within an hour of our visit to make sure the prescription was  transmitted appropriately.   Please try these tips to maintain a healthy lifestyle:  Eat at least 3 REAL meals and 1-2 snacks per day.  Aim for no more than 5 hours between eating.  If you eat breakfast, please do so within one hour of getting up.   Each meal should contain half fruits/vegetables, one quarter protein, and one quarter carbs (no bigger than a computer mouse)  Cut down on sweet beverages. This includes juice, soda, and sweet tea.   Drink at least 1 glass of water with each meal and aim for at least 8 glasses per day  Exercise at least 150 minutes every week.

## 2024-07-22 NOTE — Telephone Encounter (Signed)
 [facility]  Procedure code [id number] does not require an authorization.  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

## 2024-07-22 NOTE — Assessment & Plan Note (Signed)
 Patient with worsening intermittent epigastric pain over the last several months.  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  This is likely contributing to his above chest discomfort.  Will restart Protonix .  He will let us  know if not improving in the next few weeks and he will need to follow back up with GI soon.

## 2024-07-24 DIAGNOSIS — E785 Hyperlipidemia, unspecified: Secondary | ICD-10-CM | POA: Insufficient documentation

## 2024-07-24 DIAGNOSIS — R072 Precordial pain: Secondary | ICD-10-CM | POA: Insufficient documentation

## 2024-07-24 NOTE — Progress Notes (Signed)
 "    Cardiology Office Note   Date:  07/25/2024   ID:  Richard Reeves, Richard Reeves 1950-08-27, MRN [id number]  PCP:  Kennyth Worth HERO, MD  Cardiologist:   None Referring:  Kennyth Worth HERO, MD  Chief Complaint  Patient presents with   Coronary Artery Disease      History of Present Illness: Richard Reeves is a 74 y.o. male who presents for evaluation of chest pain.   He has no past cardiac history.  He has not had any prior cardiac testing.  He does have longstanding diabetes of about 10 years.  He also has some mild dyslipidemia apparently.  He is limited by some right knee pain.  He goes to the gym and does a lot of leg exercises but it does not sound like he does a lot of aerobic activity.  He has been getting sharp discomfort on the left side of his chest.  This has been going on for some time.  It comes and goes.  Happens at rest.  He cannot make it come on with activities.  Some positional.  It may last for hours or days.  He does not describe associated nausea vomiting or diaphoresis.  Does not describe associated shortness of breath, PND or orthopnea.  He has had no weight gain or edema.   Past Medical History:  Diagnosis Date   Diabetes mellitus without complication (HCC)    Hyperlipidemia     Past Surgical History:  Procedure Laterality Date   None       Current Outpatient Medications  Medication Sig Dispense Refill   aspirin EC 81 MG tablet Take 81 mg by mouth daily.     blood glucose meter kit and supplies KIT Dispense based on patient and insurance preference. Use daily as needed to check blood sugar. 1 each 0   Blood Glucose Monitoring Suppl DEVI 1 each by Does not apply route in the morning, at noon, and at bedtime. May substitute to any manufacturer covered by patient's insurance. 1 each 0   cyclobenzaprine  (FLEXERIL ) 5 MG tablet Take 1 tablet (5 mg total) by mouth at bedtime as needed for muscle spasms. 15 tablet 0   losartan  (COZAAR ) 50 MG tablet Take 1 tablet (50 mg total) by mouth  daily. 90 tablet 3   meloxicam  (MOBIC ) 15 MG tablet Take 1 tablet (15 mg total) by mouth daily as needed for pain. 30 tablet 0   metFORMIN  (GLUCOPHAGE ) 1000 MG tablet Take 1 tablet (1,000 mg total) by mouth 2 (two) times daily with a meal. 180 tablet 3   pantoprazole  (PROTONIX ) 40 MG tablet Take 1 tablet (40 mg total) by mouth daily. 30 tablet 3   pravastatin  (PRAVACHOL ) 20 MG tablet Take 1 tablet (20 mg total) by mouth daily. 90 tablet 3   No current facility-administered medications for this visit.    Allergies:   Patient has no known allergies.    Social History:  The patient  reports that he has quit smoking. He has never used smokeless tobacco. He reports that he does not drink alcohol and does not use drugs.   Family History:  The patient's family history includes Cancer in his mother.  Father died of natural causes   ROS:  Please see the history of present illness.   Otherwise, review of systems are positive for none.   All other systems are reviewed and negative.    PHYSICAL EXAM: VS:  BP 139/70 (BP Location: Left Arm, Patient Position:  Sitting)   Pulse 61   Ht 5' 8 (1.727 m)   Wt 157 lb 6.4 oz (71.4 kg)   SpO2 98%   BMI 23.93 kg/m  , BMI Body mass index is 23.93 kg/m. GENERAL:  Well appearing HEENT:  Pupils equal round and reactive, fundi not visualized, oral mucosa unremarkable NECK:  No jugular venous distention, waveform within normal limits, carotid upstroke brisk and symmetric, no bruits, no thyromegaly LYMPHATICS:  No cervical, inguinal adenopathy LUNGS:  Clear to auscultation bilaterally BACK:  No CVA tenderness CHEST:  Unremarkable HEART:  PMI not displaced or sustained,S1 and S2 within normal limits, no S3, no S4, no clicks, no rubs, no murmurs ABD:  Flat, positive bowel sounds normal in frequency in pitch, no bruits, no rebound, no guarding, no midline pulsatile mass, no hepatomegaly, no splenomegaly EXT:  2 plus pulses throughout, no edema, no cyanosis no  clubbing SKIN:  No rashes no nodules NEURO:  Cranial nerves II through XII grossly intact, motor grossly intact throughout PSYCH:  Cognitively intact, oriented to person place and time    EKG:        Recent Labs: 03/12/2024: ALT 16; BUN 18; Creatinine, Ser 1.06; Hemoglobin 13.2; Platelets 196.0; Potassium 4.4; Sodium 140; TSH 6.48    Lipid Panel    Component Value Date/Time   CHOL 157 03/12/2024 0926   TRIG 193.0 (H) 03/12/2024 0926   HDL 50.90 03/12/2024 0926   CHOLHDL 3 03/12/2024 0926   VLDL 38.6 03/12/2024 0926   LDLCALC 68 03/12/2024 0926   LDLDIRECT 79.0 03/07/2023 1005      Wt Readings from Last 3 Encounters:  07/25/24 157 lb 6.4 oz (71.4 kg)  07/22/24 158 lb 9.6 oz (71.9 kg)  06/25/24 158 lb (71.7 kg)      Other studies Reviewed: Additional studies/ records that were reviewed today include: Labs. Review of the above records demonstrates:  Please see elsewhere in the note.     ASSESSMENT AND PLAN:   Precordial chest pain: Chest pain has some anginal and some nonanginal features.  He has significant cardiovascular risk factors with longstanding diabetes is not at target.  He needs to have obstructive coronary disease excluded.  I would not be able to walk him on a treadmill with his knee problems.  He will have a coronary CTA.  HTN: Mildly elevated and they should keep a watch on the blood pressure.  I be happy to review her blood pressure diary.  Dyslipidemia: LDL was 43 with an HDL of 41.2.  No change in therapy.  DM: A1c was 8.0.  I discussed with him goals that would be closer to 6-1/2 or 6 and I will refer him back to Dr. Kennyth.  Current medicines are reviewed at length with the patient today.  The patient does not have concerns regarding medicines.  The following changes have been made:  no change  Labs/ tests ordered today include:   Orders Placed This Encounter  Procedures   CT CORONARY MORPH W/CTA COR W/SCORE W/CA W/CM &/OR WO/CM   Basic  metabolic panel with GFR   EKG 87-[place]     Disposition:   FU with me based on the results of the above.     Signed, Lynwood Schilling, MD  07/25/2024 9:15 AM    [facility]    "

## 2024-07-25 ENCOUNTER — Ambulatory Visit: Attending: Cardiology | Admitting: Cardiology

## 2024-07-25 ENCOUNTER — Encounter: Payer: Self-pay | Admitting: Cardiology

## 2024-07-25 VITALS — BP 139/70 | HR 61 | Ht 68.0 in | Wt 157.4 lb

## 2024-07-25 DIAGNOSIS — E785 Hyperlipidemia, unspecified: Secondary | ICD-10-CM

## 2024-07-25 DIAGNOSIS — Z01812 Encounter for preprocedural laboratory examination: Secondary | ICD-10-CM

## 2024-07-25 DIAGNOSIS — R072 Precordial pain: Secondary | ICD-10-CM | POA: Diagnosis not present

## 2024-07-25 DIAGNOSIS — I10 Essential (primary) hypertension: Secondary | ICD-10-CM | POA: Diagnosis not present

## 2024-07-25 DIAGNOSIS — E119 Type 2 diabetes mellitus without complications: Secondary | ICD-10-CM | POA: Diagnosis not present

## 2024-07-25 LAB — BASIC METABOLIC PANEL WITH GFR
BUN/Creatinine Ratio: 17 (ref 10–24)
BUN: 20 mg/dL (ref 8–27)
CO2: 25 mmol/L (ref 20–29)
Calcium: 10 mg/dL (ref 8.6–10.2)
Chloride: 100 mmol/L (ref 96–106)
Creatinine, Ser: 1.15 mg/dL (ref 0.76–1.27)
Glucose: 112 mg/dL — ABNORMAL HIGH (ref 70–99)
Potassium: 4.8 mmol/L (ref 3.5–5.2)
Sodium: 139 mmol/L (ref 134–144)
eGFR: 68 mL/min/1.73

## 2024-07-25 NOTE — Patient Instructions (Addendum)
 Medication Instructions:  Your physician recommends that you continue on your current medications as directed. Please refer to the Current Medication list given to you today.  *If you need a refill on your cardiac medications before your next appointment, please call your pharmacy*  Lab Work: BMET today at [facility] If you have labs (blood work) drawn today and your tests are completely normal, you will receive your results only by: MyChart Message (if you have MyChart) OR A paper copy in the mail If you have any lab test that is abnormal or we need to change your treatment, we will call you to review the results.  Testing/Procedures: Coronary CTA  Follow-Up: At [facility], you and your health needs are our priority.  As part of our continuing mission to provide you with exceptional heart care, our providers are all part of one team.  This team includes your primary Cardiologist (physician) and Advanced Practice Providers or APPs (Physician Assistants and Nurse Practitioners) who all work together to provide you with the care you need, when you need it.  Your next appointment:   As needed  Provider:   [place], MD  We recommend signing up for the patient portal called MyChart.  Sign up information is provided on this After Visit Summary.  MyChart is used to connect with patients for Virtual Visits (Telemedicine).  Patients are able to view lab/test results, encounter notes, upcoming appointments, etc.  Non-urgent messages can be sent to your provider as well.   To learn more about what you can do with MyChart, go to [email or web address].   Other Instructions   Your cardiac CT will be scheduled at one of the below locations:   [facility] [address]  [place], [place] [address]  If scheduled at the [facility] at [address], please enter the parking lot using the [address] entrance and use the FREE valet service  at the patient drop-off area. Enter the building and check-in with registration on the main floor.  Please follow these instructions carefully (unless otherwise directed):  An IV will be required for this test and Nitroglycerin will be given.  Hold all erectile dysfunction medications at least 3 days (72 hrs) prior to test. (Ie viagra, cialis, sildenafil, tadalafil, etc)   On the Night Before the Test: Be sure to Drink plenty of water. Do not consume any caffeinated/decaffeinated beverages or chocolate 12 hours prior to your test. Do not take any antihistamines 12 hours prior to your test.  On the Day of the Test: Drink plenty of water until 1 hour prior to the test. Do not eat any food 1 hour prior to test. You may take your regular medications prior to the test.  If you take Furosemide/Hydrochlorothiazide/Spironolactone/Chlorthalidone, please HOLD on the morning of the test. Patients who wear a continuous glucose monitor MUST remove the device prior to scanning. FEMALES- please wear underwire-free bra if available, avoid dresses & tight clothing      After the Test: Drink plenty of water. After receiving IV contrast, you may experience a mild flushed feeling. This is normal. On occasion, you may experience a mild rash up to 24 hours after the test. This is not dangerous. If this occurs, you can take Benadryl 25 mg, Zyrtec, Claritin, or Allegra and increase your fluid intake. (Patients taking Tikosyn should avoid Benadryl, and may take Zyrtec, Claritin, or Allegra) If you experience trouble breathing, this can be serious. If it is severe  call 911 IMMEDIATELY. If it is mild, please call our office.  We will call to schedule your test 2-4 weeks out understanding that some insurance companies will need an authorization prior to the service being performed.   For more information and frequently asked questions, please visit our website : [email or web address]  For  non-scheduling related questions, please contact the cardiac imaging nurse navigator should you have any questions/concerns: Cardiac Imaging Nurse Navigators Direct Office Dial: [phone]   For scheduling needs, including cancellations and rescheduling, please call Brittany, [phone].

## 2024-07-26 ENCOUNTER — Ambulatory Visit: Payer: Self-pay | Admitting: Cardiology

## 2024-07-26 NOTE — Telephone Encounter (Signed)
 Please schedule patient when medication is stocked   [facility]  authorized bilateral knee NO PRE CERT REQUIRED Coinsurance 20% Deductible does not apply to these services OOP Max $8000 has met $0 Copay $45 Once OOP has been met coverage goes to 100% and copay will no longer apply  Reference # [id number]

## 2024-07-30 NOTE — Telephone Encounter (Signed)
 Per patient, injections are not needed at this time. He will call back to schedule.

## 2024-08-06 ENCOUNTER — Ambulatory Visit: Admitting: Sports Medicine

## 2024-08-06 ENCOUNTER — Telehealth: Payer: Self-pay | Admitting: *Deleted

## 2024-08-06 ENCOUNTER — Telehealth: Payer: Self-pay | Admitting: Family Medicine

## 2024-08-06 NOTE — Telephone Encounter (Signed)
 Copied from CRM [id number]. Topic: General - Other >> Aug 06, 2024 11:40 AM Joesph NOVAK wrote: Reason for CRM: pt needs a authorization for MRI through insurance. [place] pre cert dept is calling requesting this to be done before appt tomorrow. Please fu

## 2024-08-06 NOTE — Telephone Encounter (Signed)
 Copied from CRM [id number]. Topic: General - Other >> Aug 06, 2024 11:40 AM Joesph NOVAK wrote: Reason for CRM: pt needs a authorization for MRI through insurance. [place] pre cert dept is calling requesting this to be done before appt tomorrow. Please fu >> Aug 06, 2024  1:41 PM Alexandria E wrote: Melissa with [facility] called stating that prior auth needs to be done for patient's MRI tomorrow 1/21. Callback number for Eleanor is [phone]. Or secure chat through Epic would be Melissa Xayasine.   Patient has an appt tomorrow need PA [facility]

## 2024-08-06 NOTE — Telephone Encounter (Signed)
 Patient is to have MRI completed tomorrow morning  Copied from CRM [id number]. Topic: General - Other >> Aug 06, 2024 11:40 AM Joesph NOVAK wrote: Reason for CRM: pt needs a authorization for MRI through insurance. [place] pre cert dept is calling requesting this to be done before appt tomorrow. Please fu >> Aug 06, 2024  1:41 PM Alexandria E wrote: Melissa with [facility] called stating that prior auth needs to be done for patient's MRI tomorrow 1/21. Callback number for Eleanor is [phone]. Or secure chat through Epic would be Melissa Xayasine.

## 2024-08-07 ENCOUNTER — Ambulatory Visit (HOSPITAL_COMMUNITY)
Admission: RE | Admit: 2024-08-07 | Discharge: 2024-08-07 | Disposition: A | Discharge status: Home / Self Care | Source: Ambulatory Visit | Attending: Family Medicine | Admitting: Family Medicine

## 2024-08-07 DIAGNOSIS — R519 Headache, unspecified: Secondary | ICD-10-CM | POA: Diagnosis present

## 2024-08-07 NOTE — Telephone Encounter (Signed)
 This needs to be sent to [facility] or someone else who can work on the [place].

## 2024-08-07 NOTE — Telephone Encounter (Signed)
 PA done, MRI was done today,

## 2024-08-09 ENCOUNTER — Ambulatory Visit: Payer: Self-pay | Admitting: Family Medicine

## 2024-08-09 NOTE — Progress Notes (Signed)
 Good news!  His MRI was normal.  He should let us  know if his headaches are not improving or if he is developing any new symptoms.

## 2024-08-12 NOTE — Telephone Encounter (Signed)
 MRI approved and completed on  1/21  Closing message

## 2024-08-13 NOTE — Telephone Encounter (Signed)
 Called and spoke to patient and he expressed that the headaches not present but he is having lightheadedness and pressure. He expressed that his blood pressure has been pretty okay, slightly elevated at times. He went to cardiologist and his B/P was elevated. Expressed that the light headedness occurs maybe once a week and last 2/3 hours. Patient refused to schedule an appointment but would like to know what the next should be as far as lightheadedness. Please advise on next steps. Please and thank you     Copied from CRM #[id number]. Topic: Clinical - Lab/Test Results >> Aug 12, 2024 10:24 AM Adelita E wrote: Reason for CRM: Wife returning call for MRI results. Please call back when results can be discussed, will need Korean interpreter to join.

## 2024-08-13 NOTE — Progress Notes (Signed)
 Recommend he stay well hydrated and push fluids. Next time he has an episode I would like for him to check his BP and blood sugar if possible and also keep a log to see if he can identify any triggers. Recommend follow up appointment here if still not improving.

## 2024-09-17 ENCOUNTER — Ambulatory Visit: Admitting: Family Medicine

## 2025-03-25 ENCOUNTER — Ambulatory Visit

## 2025-03-25 ENCOUNTER — Encounter: Admitting: Family Medicine
